# Patient Record
Sex: Male | Born: 1975
Health system: Southern US, Community
[De-identification: ages and names within clinical notes are randomized; demographics above are authoritative.]

## PROBLEM LIST (undated history)

## (undated) DIAGNOSIS — L409 Psoriasis, unspecified: Secondary | ICD-10-CM

## (undated) DIAGNOSIS — M549 Dorsalgia, unspecified: Secondary | ICD-10-CM

## (undated) DIAGNOSIS — G8929 Other chronic pain: Secondary | ICD-10-CM

## (undated) DIAGNOSIS — R519 Headache, unspecified: Secondary | ICD-10-CM

## (undated) DIAGNOSIS — R51 Headache: Secondary | ICD-10-CM

## (undated) DIAGNOSIS — Z8709 Personal history of other diseases of the respiratory system: Secondary | ICD-10-CM

## (undated) DIAGNOSIS — M62838 Other muscle spasm: Secondary | ICD-10-CM

## (undated) DIAGNOSIS — G629 Polyneuropathy, unspecified: Secondary | ICD-10-CM

## (undated) DIAGNOSIS — K5900 Constipation, unspecified: Secondary | ICD-10-CM

---
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
## 2010-02-02 HISTORY — PX: BACK SURGERY: SHX140

## 2011-03-17 ENCOUNTER — Encounter: Payer: Managed Care, Other (non HMO) | Admitting: Physical Medicine & Rehabilitation

## 2011-05-05 ENCOUNTER — Encounter
Payer: Managed Care, Other (non HMO) | Attending: Physical Medicine & Rehabilitation | Admitting: Physical Medicine & Rehabilitation

## 2011-05-05 DIAGNOSIS — M545 Low back pain, unspecified: Secondary | ICD-10-CM | POA: Insufficient documentation

## 2011-05-05 DIAGNOSIS — M961 Postlaminectomy syndrome, not elsewhere classified: Secondary | ICD-10-CM | POA: Insufficient documentation

## 2011-05-05 DIAGNOSIS — IMO0002 Reserved for concepts with insufficient information to code with codable children: Secondary | ICD-10-CM | POA: Insufficient documentation

## 2011-05-05 DIAGNOSIS — M47817 Spondylosis without myelopathy or radiculopathy, lumbosacral region: Secondary | ICD-10-CM

## 2011-05-05 DIAGNOSIS — R209 Unspecified disturbances of skin sensation: Secondary | ICD-10-CM | POA: Insufficient documentation

## 2011-05-05 DIAGNOSIS — M79609 Pain in unspecified limb: Secondary | ICD-10-CM | POA: Insufficient documentation

## 2011-05-05 NOTE — Progress Notes (Signed)
CHIEF COMPLAINT:  Back pain and leg pain.  HISTORY OF PRESENT ILLNESS:  This is a 35 year old white male who has had a history of 2 lumbar spine surgeries.  He had lumbar laminectomy in 2005 and then other laminectomy at L4-L5, L5-S1 in March 2011.  These were done in [place].  At the time of his back surgery he had numbness and pain in the legs as well as low back pain.  Since the back surgeries his pain is generally improves especially over the last 2 months.  His symptoms are primarily in the back and hips now.  They are worsen when he stands for long periods of time or sits.  He found walking is fair rather than sitting.  He tries to stay active in elliptical and has lost some weight, notes that has improved his pain. He attributes lot of his problems to heavy weight lifting when he was younger.  Pain 4-5/10, described as sharp, burning, tingling, and aching.  Pain interferes with general activities, relations with others, enjoyment of life on a moderate level.  REVIEW OF SYSTEMS:  Notable for numbness, tingling, and spasms.  Full 12- point review is in the written health and history section of the chart.  CURRENT MEDICATIONS: 1. Hydrocodone 10/650 after one twice daily as needed.  He rarely     using this at this point. 2. Flexeril 10 mg t.i.d. 3. Lovastatin 40 mg daily. 4. Benazepril 40 mg b.i.d. 5. Trilipix 45 mg daily. 6. Ibuprofen 250 mg q.4 h. p.r.n.  PAST MEDICAL HISTORY:  Notable for hypertension, increased lipids, and back problems as mentioned above.  SOCIAL HISTORY:  The patient is single, lives with a roommate.  He drinks 3-4 drinks a week.  He works as a Production designer, theatre/television/film at [employer] in [place].  He works about 50 hours per week.  FAMILY HISTORY:  Negative and full review is in the written health and history section of the chart.  RADIOGRAPHY:  I reviewed a MRI from September of last year which showed only mild degenerative disk disease at L5-S1 and his  posterior laminectomy at L4-L5, L5-S1.  PHYSICAL EXAMINATION:  GENERAL:  The patient is pleasant, alert, and oriented x3.  He is fairly big built. VITAL SIGNS:  Blood pressure 125/77, pulse 97, respiratory rate 16, he is satting 98% on room air. MUSCULOSKELETAL:  Standing posture is fair.  He is able to bend only to about 75 degrees with his back was too tight.  Hamstrings were both tight as well as gastrocs, abductors.  Surgical incision was noted in the area around, this was slightly tender but not extremely more so with pressure than at rest.  Straight leg raising only revealed hamstring tightness.  His Luisa Hart test and compression test were negative. Reflexes are 1+ and 2+ right knee and ankle, 2+ left knee and ankle today.  Sensory exam is diminished over the L4-L5 perhaps S1 areas bilaterally right more than left.  Strength is grossly 4-5/5 throughout with some pain inhibition proximally at the right knee with extension, right hip flexion and right hip abduction.  Upper extremity strength is 5/5. HEART:  Regular. CHEST:  Clear. ABDOMEN:  Soft and nontender.  ASSESSMENT:  Post laminectomy syndrome with mild residual radiculopathy and persistent low back pain.  The patient is tight in his core muscles and lack strength in overall range of motion there too.  Posture is fairly intact.  PLAN: 1. We discussed options today, considering that he is improving.  I     think pursuing physical therapy is the most appropriate avenue.  I     recommended him attending physical therapy to address core muscle     strengthening and lengthening.  We will pursue Pilates program.  We     can look at modalities which may have benefit as well.  The patient     does have the tilt table at home which he finds useful and I think     this certainly could supplement his therapy and I think he is using     more than the 3 days a week that he currently is.  They wants to     improve his strength and his  core as well as his range of motion,     he could pursue physical activity on his own.  I think he could     come off some of the medications he is currently taking as well     including the hydrocodone.  I discussed the fact at that this point     we can't improve his disks, but we could certainly improve his     supporting musculature around them and periotically unload the     spinal segments.  He expressed an understanding and like this     approach. 2. I will see the patient back here in about 2 months.  He is to call     me if any problems or questions.     Ranelle Oyster, M.D. Electronically Signed    ZTS/MedQ D:  05/05/2011 12:59:21  T:  05/05/2011 14:07:27  Job #:  [id number]  cc:   Dr. Albertina Parr [facility] [place], [place] [address]

## 2011-07-03 ENCOUNTER — Encounter: Payer: Managed Care, Other (non HMO) | Admitting: Physical Medicine & Rehabilitation

## 2011-08-01 ENCOUNTER — Encounter
Payer: Managed Care, Other (non HMO) | Attending: Physical Medicine & Rehabilitation | Admitting: Physical Medicine & Rehabilitation

## 2013-01-29 ENCOUNTER — Other Ambulatory Visit: Payer: Self-pay | Admitting: Neurological Surgery

## 2013-01-29 DIAGNOSIS — M549 Dorsalgia, unspecified: Secondary | ICD-10-CM

## 2013-02-03 ENCOUNTER — Ambulatory Visit
Admission: RE | Admit: 2013-02-03 | Discharge: 2013-02-03 | Disposition: A | Discharge status: Home / Self Care | Payer: Managed Care, Other (non HMO) | Source: Ambulatory Visit | Attending: Neurological Surgery | Admitting: Neurological Surgery

## 2013-02-03 ENCOUNTER — Other Ambulatory Visit: Payer: Self-pay | Admitting: Neurological Surgery

## 2013-02-03 DIAGNOSIS — M549 Dorsalgia, unspecified: Secondary | ICD-10-CM

## 2013-02-03 MED ORDER — METHYLPREDNISOLONE ACETATE 40 MG/ML INJ SUSP (RADIOLOG
120.0000 mg | Freq: Once | INTRAMUSCULAR | Status: AC
  Administered 2013-02-03: 120 mg via INTRA_ARTICULAR

## 2013-02-03 MED ORDER — IOHEXOL 180 MG/ML  SOLN
2.0000 mL | Freq: Once | INTRAMUSCULAR | Status: AC | PRN
  Administered 2013-02-03: 2 mL via INTRA_ARTICULAR

## 2013-03-20 ENCOUNTER — Other Ambulatory Visit: Payer: Self-pay | Admitting: Neurological Surgery

## 2013-03-20 DIAGNOSIS — M5416 Radiculopathy, lumbar region: Secondary | ICD-10-CM

## 2013-03-25 ENCOUNTER — Ambulatory Visit
Admission: RE | Admit: 2013-03-25 | Discharge: 2013-03-25 | Disposition: A | Discharge status: Home / Self Care | Payer: Managed Care, Other (non HMO) | Source: Ambulatory Visit | Attending: Neurological Surgery | Admitting: Neurological Surgery

## 2013-03-25 VITALS — BP 107/71 | HR 88

## 2013-03-25 DIAGNOSIS — M5416 Radiculopathy, lumbar region: Secondary | ICD-10-CM

## 2013-03-25 MED ORDER — IOHEXOL 180 MG/ML  SOLN
15.0000 mL | Freq: Once | INTRAMUSCULAR | Status: AC | PRN
  Administered 2013-03-25: 15 mL via INTRATHECAL

## 2013-03-25 MED ORDER — DIAZEPAM 5 MG PO TABS
10.0000 mg | ORAL_TABLET | Freq: Once | ORAL | Status: AC
  Administered 2013-03-25: 10 mg via ORAL

## 2013-03-25 NOTE — Progress Notes (Signed)
Discharge instructions explained to pt. 

## 2013-04-07 ENCOUNTER — Other Ambulatory Visit: Payer: Self-pay | Admitting: Neurosurgery

## 2013-04-07 DIAGNOSIS — M47817 Spondylosis without myelopathy or radiculopathy, lumbosacral region: Secondary | ICD-10-CM

## 2013-04-07 DIAGNOSIS — M549 Dorsalgia, unspecified: Secondary | ICD-10-CM

## 2013-04-07 DIAGNOSIS — IMO0002 Reserved for concepts with insufficient information to code with codable children: Secondary | ICD-10-CM

## 2013-05-12 ENCOUNTER — Ambulatory Visit
Admission: RE | Admit: 2013-05-12 | Discharge: 2013-05-12 | Disposition: A | Discharge status: Home / Self Care | Payer: Managed Care, Other (non HMO) | Source: Ambulatory Visit | Attending: Neurosurgery | Admitting: Neurosurgery

## 2013-05-12 DIAGNOSIS — M549 Dorsalgia, unspecified: Secondary | ICD-10-CM

## 2013-05-12 DIAGNOSIS — M47817 Spondylosis without myelopathy or radiculopathy, lumbosacral region: Secondary | ICD-10-CM

## 2013-05-12 DIAGNOSIS — IMO0002 Reserved for concepts with insufficient information to code with codable children: Secondary | ICD-10-CM

## 2013-05-22 ENCOUNTER — Other Ambulatory Visit: Payer: Managed Care, Other (non HMO)

## 2013-06-06 ENCOUNTER — Other Ambulatory Visit: Payer: Self-pay | Admitting: Neurological Surgery

## 2013-06-24 ENCOUNTER — Encounter (HOSPITAL_COMMUNITY)
Admission: RE | Admit: 2013-06-24 | Discharge: 2013-06-24 | Disposition: A | Discharge status: Home / Self Care | Payer: Managed Care, Other (non HMO) | Source: Ambulatory Visit | Attending: Neurological Surgery | Admitting: Neurological Surgery

## 2013-06-24 ENCOUNTER — Ambulatory Visit (HOSPITAL_COMMUNITY)
Admission: RE | Admit: 2013-06-24 | Discharge: 2013-06-24 | Disposition: A | Discharge status: Home / Self Care | Payer: Managed Care, Other (non HMO) | Source: Ambulatory Visit | Attending: Anesthesiology | Admitting: Anesthesiology

## 2013-06-24 ENCOUNTER — Encounter (HOSPITAL_COMMUNITY): Payer: Self-pay

## 2013-06-24 DIAGNOSIS — Z01810 Encounter for preprocedural cardiovascular examination: Secondary | ICD-10-CM | POA: Insufficient documentation

## 2013-06-24 DIAGNOSIS — Z01812 Encounter for preprocedural laboratory examination: Secondary | ICD-10-CM | POA: Insufficient documentation

## 2013-06-24 DIAGNOSIS — Z01818 Encounter for other preprocedural examination: Secondary | ICD-10-CM | POA: Insufficient documentation

## 2013-06-24 LAB — CBC WITH DIFFERENTIAL/PLATELET
Basophils Absolute: 0 10*3/uL (ref 0.0–0.1)
Eosinophils Relative: 2 % (ref 0–5)
HCT: 44 % (ref 39.0–52.0)
Hemoglobin: 15.3 g/dL (ref 13.0–17.0)
Lymphocytes Relative: 32 % (ref 12–46)
Lymphs Abs: 2.4 10*3/uL (ref 0.7–4.0)
MCV: 88 fL (ref 78.0–100.0)
Monocytes Absolute: 0.5 10*3/uL (ref 0.1–1.0)
Monocytes Relative: 7 % (ref 3–12)
Neutro Abs: 4.4 10*3/uL (ref 1.7–7.7)
RBC: 5 MIL/uL (ref 4.22–5.81)
WBC: 7.5 10*3/uL (ref 4.0–10.5)

## 2013-06-24 LAB — BASIC METABOLIC PANEL
CO2: 25 mEq/L (ref 19–32)
Chloride: 101 mEq/L (ref 96–112)
Creatinine, Ser: 0.84 mg/dL (ref 0.50–1.35)
GFR calc Af Amer: >90 mL/min (ref >90)
Potassium: 3.9 mEq/L (ref 3.5–5.1)
Sodium: 138 mEq/L (ref 135–145)

## 2013-06-24 LAB — SURGICAL PCR SCREEN
MRSA, PCR: NEGATIVE
Staphylococcus aureus: NEGATIVE

## 2013-06-24 LAB — TYPE AND SCREEN
ABO/RH(D): A NEG
Antibody Screen: NEGATIVE

## 2013-06-24 LAB — ABO/RH: ABO/RH(D): A NEG

## 2013-06-24 LAB — PROTIME-INR: INR: 0.9 (ref 0.00–1.49)

## 2013-06-24 NOTE — Pre-Procedure Instructions (Addendum)
[name]  06/24/2013   Your procedure is scheduled on: 07/03/13  Report to [facility]  2 * 3 at 530 AM.  Call this number if you have problems the morning of surgery: [phone]   Remember:   Do not eat food or drink liquids after midnight.   Take these medicines the morning of surgery with A SIP OF WATER: pain med, gabapentin,flexeril   Do not wear jewelry, make-up or nail polish.  Do not wear lotions, powders, or perfumes. You may wear deodorant.  Do not shave 48 hours prior to surgery. Men may shave face and neck.  Do not bring valuables to the hospital.  [facility] is not responsible                  for any belongings or valuables.               Contacts, dentures or bridgework may not be worn into surgery.  Leave suitcase in the car. After surgery it may be brought to your room.  For patients admitted to the hospital, discharge time is determined by your                treatment team.               Patients discharged the day of surgery will not be allowed to drive  home.  Name and phone number of your driver:   Special Instructions: Shower using CHG 2 nights before surgery and the night before surgery.  If you shower the day of surgery use CHG.  Use special wash - you have one bottle of CHG for all showers.  You should use approximately 1/3 of the bottle for each shower.   Please read over the following fact sheets that you were given: Pain Booklet, Coughing and Deep Breathing, Blood Transfusion Information, MRSA Information and Surgical Site Infection Prevention

## 2013-06-25 NOTE — Progress Notes (Signed)
Anesthesia Chart Review:  Patient is a 37 year old male scheduled for L4-5, L5-S1 MAS, PLIF on 07/03/13 by Dr. Yetta Barre.  History includes non-smoker, HTN, previous back surgery.  PCP is listed as Dr. Albertina Parr.  Preoperative labs noted.  EKG on 06/24/13 showed NSR.  CXR on 06/24/13 showed: 1. No active lung disease.  2. Minimally prominent right paratracheal soft tissue most likely vascular. Recommend attention to this area on followup chest x-ray.  I called CXR result to [facility] at Dr. Yetta Barre' office.  She will forward to Dr. Yetta Barre for review and additional recommendations as it could be likely be re-evaluated post-operatively or on an out-patient basis by his PCP.  If not acute symptoms, finding should not interfere with proceeding to OR.  Velna [facility]/Anesthesiology Phone [phone] 06/25/2013 3:28 PM

## 2013-07-02 MED ORDER — CEFAZOLIN SODIUM-DEXTROSE 2-3 GM-% IV SOLR
2.0000 g | INTRAVENOUS | Status: AC
  Administered 2013-07-03: 2 g via INTRAVENOUS
  Filled 2013-07-02: qty 50

## 2013-07-03 ENCOUNTER — Inpatient Hospital Stay (HOSPITAL_COMMUNITY)
Admission: RE | Admit: 2013-07-03 | Discharge: 2013-07-06 | DRG: 460 | Disposition: A | Discharge status: Home / Self Care | Payer: 59 | Source: Ambulatory Visit | Attending: Neurological Surgery | Admitting: Neurological Surgery

## 2013-07-03 ENCOUNTER — Encounter (HOSPITAL_COMMUNITY)
Admission: RE | Disposition: A | Discharge status: Home / Self Care | Payer: Managed Care, Other (non HMO) | Source: Ambulatory Visit | Attending: Neurological Surgery

## 2013-07-03 ENCOUNTER — Encounter (HOSPITAL_COMMUNITY): Payer: Self-pay | Admitting: Certified Registered Nurse Anesthetist

## 2013-07-03 ENCOUNTER — Inpatient Hospital Stay (HOSPITAL_COMMUNITY): Payer: 59

## 2013-07-03 ENCOUNTER — Inpatient Hospital Stay (HOSPITAL_COMMUNITY): Payer: 59 | Admitting: Certified Registered Nurse Anesthetist

## 2013-07-03 ENCOUNTER — Encounter (HOSPITAL_COMMUNITY): Payer: 59 | Admitting: Vascular Surgery

## 2013-07-03 DIAGNOSIS — M51379 Other intervertebral disc degeneration, lumbosacral region without mention of lumbar back pain or lower extremity pain: Principal | ICD-10-CM | POA: Diagnosis present

## 2013-07-03 DIAGNOSIS — I10 Essential (primary) hypertension: Secondary | ICD-10-CM | POA: Diagnosis present

## 2013-07-03 DIAGNOSIS — M5137 Other intervertebral disc degeneration, lumbosacral region: Principal | ICD-10-CM | POA: Diagnosis present

## 2013-07-03 DIAGNOSIS — Z981 Arthrodesis status: Secondary | ICD-10-CM

## 2013-07-03 DIAGNOSIS — M961 Postlaminectomy syndrome, not elsewhere classified: Secondary | ICD-10-CM | POA: Diagnosis present

## 2013-07-03 HISTORY — PX: LUMBAR LAMINECTOMY: SHX95

## 2013-07-03 HISTORY — PX: MAXIMUM ACCESS (MAS)POSTERIOR LUMBAR INTERBODY FUSION (PLIF) 2 LEVEL: SHX6369

## 2013-07-03 SURGERY — FOR MAXIMUM ACCESS (MAS) POSTERIOR LUMBAR INTERBODY FUSION (PLIF) 2 LEVEL
Anesthesia: General | Site: Back | Wound class: Clean

## 2013-07-03 MED ORDER — DEXAMETHASONE 4 MG PO TABS
4.0000 mg | ORAL_TABLET | Freq: Four times a day (QID) | ORAL | Status: DC
  Administered 2013-07-03 – 2013-07-06 (×11): 4 mg via ORAL
  Filled 2013-07-03 (×15): qty 1

## 2013-07-03 MED ORDER — FENTANYL CITRATE 0.05 MG/ML IJ SOLN
INTRAMUSCULAR | Status: DC | PRN
  Administered 2013-07-03: 100 ug via INTRAVENOUS
  Administered 2013-07-03: 250 ug via INTRAVENOUS
  Administered 2013-07-03 (×8): 50 ug via INTRAVENOUS

## 2013-07-03 MED ORDER — ALBUMIN HUMAN 5 % IV SOLN
INTRAVENOUS | Status: DC | PRN
  Administered 2013-07-03: 11:00:00 via INTRAVENOUS

## 2013-07-03 MED ORDER — POTASSIUM CHLORIDE IN NACL 20-0.9 MEQ/L-% IV SOLN
INTRAVENOUS | Status: DC
  Administered 2013-07-04 – 2013-07-05 (×3): via INTRAVENOUS
  Filled 2013-07-03 (×7): qty 1000

## 2013-07-03 MED ORDER — SUCCINYLCHOLINE CHLORIDE 20 MG/ML IJ SOLN
INTRAMUSCULAR | Status: DC | PRN
  Administered 2013-07-03: 120 mg via INTRAVENOUS

## 2013-07-03 MED ORDER — HYDROMORPHONE HCL PF 1 MG/ML IJ SOLN
INTRAMUSCULAR | Status: AC
  Filled 2013-07-03: qty 1

## 2013-07-03 MED ORDER — METHOCARBAMOL 500 MG PO TABS
500.0000 mg | ORAL_TABLET | Freq: Four times a day (QID) | ORAL | Status: DC | PRN
  Administered 2013-07-04 – 2013-07-06 (×5): 500 mg via ORAL
  Filled 2013-07-03 (×6): qty 1

## 2013-07-03 MED ORDER — SODIUM CHLORIDE 0.9 % IV SOLN: 250.0000 mL | INTRAVENOUS | Status: DC

## 2013-07-03 MED ORDER — MENTHOL 3 MG MT LOZG: 1.0000 | LOZENGE | OROMUCOSAL | Status: DC | PRN

## 2013-07-03 MED ORDER — CEFAZOLIN SODIUM-DEXTROSE 2-3 GM-% IV SOLR
INTRAVENOUS | Status: AC
  Administered 2013-07-03: 2 g via INTRAVENOUS
  Filled 2013-07-03: qty 50

## 2013-07-03 MED ORDER — DIAZEPAM 5 MG/ML IJ SOLN
2.5000 mg | Freq: Once | INTRAMUSCULAR | Status: AC
  Administered 2013-07-03: 2.5 mg via INTRAVENOUS

## 2013-07-03 MED ORDER — DIAZEPAM 5 MG/ML IJ SOLN
INTRAMUSCULAR | Status: AC
  Filled 2013-07-03: qty 2

## 2013-07-03 MED ORDER — DIPHENHYDRAMINE HCL 50 MG/ML IJ SOLN: 12.5000 mg | Freq: Four times a day (QID) | INTRAMUSCULAR | Status: DC | PRN

## 2013-07-03 MED ORDER — LIDOCAINE HCL (CARDIAC) 20 MG/ML IV SOLN
INTRAVENOUS | Status: DC | PRN
  Administered 2013-07-03: 80 mg via INTRAVENOUS

## 2013-07-03 MED ORDER — DEXAMETHASONE SODIUM PHOSPHATE 10 MG/ML IJ SOLN: 10.0000 mg | INTRAMUSCULAR | Status: DC

## 2013-07-03 MED ORDER — SODIUM CHLORIDE 0.9 % IV SOLN
INTRAVENOUS | Status: DC | PRN
  Administered 2013-07-03: 12:00:00 via INTRAVENOUS

## 2013-07-03 MED ORDER — SODIUM CHLORIDE 0.9 % IR SOLN
Status: DC | PRN
  Administered 2013-07-03: 08:00:00

## 2013-07-03 MED ORDER — MORPHINE SULFATE 2 MG/ML IJ SOLN: 1.0000 mg | INTRAMUSCULAR | Status: DC | PRN

## 2013-07-03 MED ORDER — ONDANSETRON HCL 4 MG/2ML IJ SOLN: 4.0000 mg | Freq: Four times a day (QID) | INTRAMUSCULAR | Status: DC | PRN

## 2013-07-03 MED ORDER — DIPHENHYDRAMINE HCL 12.5 MG/5ML PO ELIX: 12.5000 mg | ORAL_SOLUTION | Freq: Four times a day (QID) | ORAL | Status: DC | PRN

## 2013-07-03 MED ORDER — 0.9 % SODIUM CHLORIDE (POUR BTL) OPTIME
TOPICAL | Status: DC | PRN
  Administered 2013-07-03: 1000 mL

## 2013-07-03 MED ORDER — SENNA 8.6 MG PO TABS
1.0000 | ORAL_TABLET | Freq: Two times a day (BID) | ORAL | Status: DC
  Administered 2013-07-03 – 2013-07-06 (×6): 8.6 mg via ORAL
  Filled 2013-07-03 (×7): qty 1

## 2013-07-03 MED ORDER — ONDANSETRON HCL 4 MG/2ML IJ SOLN: 4.0000 mg | INTRAMUSCULAR | Status: DC | PRN

## 2013-07-03 MED ORDER — SODIUM CHLORIDE 0.9 % IJ SOLN: 9.0000 mL | INTRAMUSCULAR | Status: DC | PRN

## 2013-07-03 MED ORDER — ACETAMINOPHEN 650 MG RE SUPP: 650.0000 mg | RECTAL | Status: DC | PRN

## 2013-07-03 MED ORDER — LACTATED RINGERS IV SOLN
INTRAVENOUS | Status: DC | PRN
  Administered 2013-07-03 (×3): via INTRAVENOUS

## 2013-07-03 MED ORDER — NALOXONE HCL 0.4 MG/ML IJ SOLN: 0.4000 mg | INTRAMUSCULAR | Status: DC | PRN

## 2013-07-03 MED ORDER — LIDOCAINE HCL 4 % MT SOLN
OROMUCOSAL | Status: DC | PRN
  Administered 2013-07-03: 4 mL via TOPICAL

## 2013-07-03 MED ORDER — SODIUM CHLORIDE 0.9 % IJ SOLN: 3.0000 mL | INTRAMUSCULAR | Status: DC | PRN

## 2013-07-03 MED ORDER — DEXTROSE 5 % IV SOLN
500.0000 mg | Freq: Four times a day (QID) | INTRAVENOUS | Status: DC | PRN
  Administered 2013-07-03: 500 mg via INTRAVENOUS
  Filled 2013-07-03: qty 5

## 2013-07-03 MED ORDER — ONDANSETRON HCL 4 MG/2ML IJ SOLN
INTRAMUSCULAR | Status: DC | PRN
  Administered 2013-07-03: 4 mg via INTRAVENOUS

## 2013-07-03 MED ORDER — HYDROMORPHONE HCL PF 1 MG/ML IJ SOLN
0.2500 mg | INTRAMUSCULAR | Status: DC | PRN
Start: 2013-07-03 — End: 2013-07-03
  Administered 2013-07-03 (×4): 0.5 mg via INTRAVENOUS

## 2013-07-03 MED ORDER — GABAPENTIN 300 MG PO CAPS
600.0000 mg | ORAL_CAPSULE | Freq: Three times a day (TID) | ORAL | Status: DC
  Administered 2013-07-03 – 2013-07-06 (×8): 600 mg via ORAL
  Filled 2013-07-03 (×10): qty 2

## 2013-07-03 MED ORDER — KETOROLAC TROMETHAMINE 30 MG/ML IJ SOLN
INTRAMUSCULAR | Status: DC | PRN
  Administered 2013-07-03: 30 mg via INTRAVENOUS

## 2013-07-03 MED ORDER — ZOLPIDEM TARTRATE 5 MG PO TABS: 5.0000 mg | ORAL_TABLET | Freq: Every evening | ORAL | Status: DC | PRN

## 2013-07-03 MED ORDER — CEFAZOLIN SODIUM 1-5 GM-% IV SOLN
1.0000 g | Freq: Three times a day (TID) | INTRAVENOUS | Status: AC
  Administered 2013-07-03 – 2013-07-04 (×2): 1 g via INTRAVENOUS
  Filled 2013-07-03 (×2): qty 50

## 2013-07-03 MED ORDER — DEXAMETHASONE SODIUM PHOSPHATE 10 MG/ML IJ SOLN
INTRAMUSCULAR | Status: AC
  Administered 2013-07-03: 10 mg via INTRAVENOUS
  Filled 2013-07-03: qty 1

## 2013-07-03 MED ORDER — ACETAMINOPHEN 10 MG/ML IV SOLN
INTRAVENOUS | Status: AC
  Administered 2013-07-03: 1000 mg via INTRAVENOUS
  Filled 2013-07-03: qty 100

## 2013-07-03 MED ORDER — SODIUM CHLORIDE 0.9 % IJ SOLN
3.0000 mL | Freq: Two times a day (BID) | INTRAMUSCULAR | Status: DC
  Administered 2013-07-03 – 2013-07-04 (×2): 3 mL via INTRAVENOUS

## 2013-07-03 MED ORDER — HYDROMORPHONE 0.3 MG/ML IV SOLN
INTRAVENOUS | Status: DC
  Administered 2013-07-03: 0.79 mg via INTRAVENOUS
  Administered 2013-07-03: 16:00:00 via INTRAVENOUS
  Administered 2013-07-03 – 2013-07-04 (×2): 2.59 mg via INTRAVENOUS
  Administered 2013-07-04: 02:00:00 via INTRAVENOUS
  Administered 2013-07-04: 0.3 mg via INTRAVENOUS
  Administered 2013-07-04: 1.39 mg via INTRAVENOUS
  Administered 2013-07-04: 1.99 mg via INTRAVENOUS
  Administered 2013-07-05: 0.999 mg via INTRAVENOUS
  Administered 2013-07-05: 0.2 mg via INTRAVENOUS
  Administered 2013-07-05: 1.79 mg via INTRAVENOUS
  Filled 2013-07-03 (×2): qty 25

## 2013-07-03 MED ORDER — PROPOFOL INFUSION 10 MG/ML OPTIME
INTRAVENOUS | Status: DC | PRN
  Administered 2013-07-03: 50 ug/kg/min via INTRAVENOUS

## 2013-07-03 MED ORDER — CELECOXIB 200 MG PO CAPS
200.0000 mg | ORAL_CAPSULE | Freq: Two times a day (BID) | ORAL | Status: DC
  Administered 2013-07-03 – 2013-07-06 (×6): 200 mg via ORAL
  Filled 2013-07-03 (×7): qty 1

## 2013-07-03 MED ORDER — MIDAZOLAM HCL 5 MG/5ML IJ SOLN
INTRAMUSCULAR | Status: DC | PRN
  Administered 2013-07-03 (×2): 2 mg via INTRAVENOUS

## 2013-07-03 MED ORDER — THROMBIN 20000 UNITS EX SOLR
CUTANEOUS | Status: DC | PRN
  Administered 2013-07-03: 08:00:00 via TOPICAL

## 2013-07-03 MED ORDER — PHENOL 1.4 % MT LIQD: 1.0000 | OROMUCOSAL | Status: DC | PRN

## 2013-07-03 MED ORDER — OXYCODONE-ACETAMINOPHEN 5-325 MG PO TABS
1.0000 | ORAL_TABLET | ORAL | Status: DC | PRN
  Administered 2013-07-03 – 2013-07-06 (×6): 2 via ORAL
  Filled 2013-07-03 (×5): qty 2

## 2013-07-03 MED ORDER — OXYCODONE-ACETAMINOPHEN 5-325 MG PO TABS
ORAL_TABLET | ORAL | Status: AC
  Filled 2013-07-03: qty 2

## 2013-07-03 MED ORDER — HYDROMORPHONE 0.3 MG/ML IV SOLN
INTRAVENOUS | Status: AC
  Filled 2013-07-03: qty 25

## 2013-07-03 MED ORDER — ACETAMINOPHEN 325 MG PO TABS: 650.0000 mg | ORAL_TABLET | ORAL | Status: DC | PRN

## 2013-07-03 MED ORDER — THROMBIN 5000 UNITS EX SOLR
CUTANEOUS | Status: DC | PRN
  Administered 2013-07-03: 5000 [IU] via TOPICAL

## 2013-07-03 MED ORDER — HEMOSTATIC AGENTS (NO CHARGE) OPTIME
TOPICAL | Status: DC | PRN
  Administered 2013-07-03: 1 via TOPICAL

## 2013-07-03 MED ORDER — PROPOFOL 10 MG/ML IV BOLUS
INTRAVENOUS | Status: DC | PRN
  Administered 2013-07-03: 160 mg via INTRAVENOUS

## 2013-07-03 MED ORDER — DEXAMETHASONE SODIUM PHOSPHATE 4 MG/ML IJ SOLN
4.0000 mg | Freq: Four times a day (QID) | INTRAMUSCULAR | Status: DC
  Filled 2013-07-03 (×15): qty 1

## 2013-07-03 MED ORDER — METOCLOPRAMIDE HCL 5 MG/ML IJ SOLN: 5.0000 mg | Freq: Once | INTRAMUSCULAR | Status: DC

## 2013-07-03 MED ORDER — ARTIFICIAL TEARS OP OINT
TOPICAL_OINTMENT | OPHTHALMIC | Status: DC | PRN
  Administered 2013-07-03: 1 via OPHTHALMIC

## 2013-07-03 SURGICAL SUPPLY — 66 items
BAG DECANTER FOR FLEXI CONT (MISCELLANEOUS) ×2 IMPLANT
BENZOIN TINCTURE PRP APPL 2/3 (GAUZE/BANDAGES/DRESSINGS) ×2 IMPLANT
BLADE SURG ROTATE 9660 (MISCELLANEOUS) IMPLANT
BONE MATRIX OSTEOCEL PRO MED (Bone Implant) ×4 IMPLANT
BUR MATCHSTICK NEURO 3.0 LAGG (BURR) ×2 IMPLANT
CAGE COROENT MP 8X23 (Cage) ×4 IMPLANT
CAGE SPINAL COROENT MP 11X28X9 (Cage) ×2 IMPLANT
CANISTER SUCT 3000ML (MISCELLANEOUS) ×2 IMPLANT
CLIP NEUROVISION LG (CLIP) ×2 IMPLANT
CONT SPEC 4OZ CLIKSEAL STRL BL (MISCELLANEOUS) ×4 IMPLANT
COROENT MP 11X28X9MM (Cage) ×2 IMPLANT
COVER BACK TABLE 24X17X13 BIG (DRAPES) IMPLANT
COVER TABLE BACK 60X90 (DRAPES) ×2 IMPLANT
DRAPE C-ARM 42X72 X-RAY (DRAPES) ×2 IMPLANT
DRAPE C-ARMOR (DRAPES) ×2 IMPLANT
DRAPE LAPAROTOMY 100X72X124 (DRAPES) ×2 IMPLANT
DRAPE POUCH INSTRU U-SHP 10X18 (DRAPES) ×2 IMPLANT
DRAPE SURG 17X23 STRL (DRAPES) ×2 IMPLANT
DRESSING TELFA 8X3 (GAUZE/BANDAGES/DRESSINGS) IMPLANT
DRSG OPSITE 4X5.5 SM (GAUZE/BANDAGES/DRESSINGS) ×2 IMPLANT
DRSG OPSITE POSTOP 4X8 (GAUZE/BANDAGES/DRESSINGS) ×2 IMPLANT
DURAPREP 26ML APPLICATOR (WOUND CARE) ×2 IMPLANT
ELECT REM PT RETURN 9FT ADLT (ELECTROSURGICAL) ×2
ELECTRODE REM PT RTRN 9FT ADLT (ELECTROSURGICAL) ×1 IMPLANT
EVACUATOR 1/8 PVC DRAIN (DRAIN) ×2 IMPLANT
GAUZE SPONGE 4X4 16PLY XRAY LF (GAUZE/BANDAGES/DRESSINGS) IMPLANT
GLOVE BIO SURGEON STRL SZ8 (GLOVE) ×4 IMPLANT
GLOVE BIOGEL M 8.0 STRL (GLOVE) ×2 IMPLANT
GLOVE INDICATOR 7.0 STRL GRN (GLOVE) ×2 IMPLANT
GLOVE OPTIFIT SS 6.5 STRL BRWN (GLOVE) ×6 IMPLANT
GLOVE SURG SS PI 7.0 STRL IVOR (GLOVE) ×2 IMPLANT
GOWN BRE IMP SLV AUR LG STRL (GOWN DISPOSABLE) ×4 IMPLANT
GOWN BRE IMP SLV AUR XL STRL (GOWN DISPOSABLE) ×4 IMPLANT
GOWN STRL REIN 2XL LVL4 (GOWN DISPOSABLE) IMPLANT
HEMOSTAT POWDER KIT SURGIFOAM (HEMOSTASIS) ×2 IMPLANT
KIT BASIN OR (CUSTOM PROCEDURE TRAY) ×2 IMPLANT
KIT NEEDLE NVM5 EMG ELECT (KITS) ×1 IMPLANT
KIT NEEDLE NVM5 EMG ELECTRODE (KITS) ×1
KIT ROOM TURNOVER OR (KITS) ×2 IMPLANT
MILL MEDIUM DISP (BLADE) ×2 IMPLANT
NEEDLE HYPO 25X1 1.5 SAFETY (NEEDLE) ×2 IMPLANT
NS IRRIG 1000ML POUR BTL (IV SOLUTION) ×2 IMPLANT
PACK LAMINECTOMY NEURO (CUSTOM PROCEDURE TRAY) ×2 IMPLANT
PAD ARMBOARD 7.5X6 YLW CONV (MISCELLANEOUS) ×6 IMPLANT
ROD 70MM (Rod) ×2 IMPLANT
ROD SPNL 70XPREBNT NS MAS (Rod) ×2 IMPLANT
SCREW LOCK (Screw) ×6 IMPLANT
SCREW LOCK FXNS SPNE MAS PL (Screw) ×6 IMPLANT
SCREW PLIF MAS 5.0X35 (Screw) ×2 IMPLANT
SCREW SHANK 5.0X30MM (Screw) ×6 IMPLANT
SCREW SHANK 6.5X30 (Screw) ×2 IMPLANT
SCREW SHANK 6.5X65 (Screw) ×2 IMPLANT
SCREW TULIP 5.5 (Screw) ×10 IMPLANT
SPONGE LAP 4X18 X RAY DECT (DISPOSABLE) IMPLANT
SPONGE SURGIFOAM ABS GEL 100 (HEMOSTASIS) ×2 IMPLANT
STRIP CLOSURE SKIN 1/2X4 (GAUZE/BANDAGES/DRESSINGS) ×2 IMPLANT
SUT VIC AB 0 CT1 18XCR BRD8 (SUTURE) ×1 IMPLANT
SUT VIC AB 0 CT1 8-18 (SUTURE) ×1
SUT VIC AB 2-0 CP2 18 (SUTURE) ×4 IMPLANT
SUT VIC AB 3-0 SH 8-18 (SUTURE) ×4 IMPLANT
SYR 20ML ECCENTRIC (SYRINGE) ×2 IMPLANT
TOWEL OR 17X24 6PK STRL BLUE (TOWEL DISPOSABLE) ×2 IMPLANT
TOWEL OR 17X26 10 PK STRL BLUE (TOWEL DISPOSABLE) ×2 IMPLANT
TRAY FOLEY CATH 14FRSI W/METER (CATHETERS) IMPLANT
TRAY FOLEY METER SIL LF 16FR (CATHETERS) ×2 IMPLANT
WATER STERILE IRR 1000ML POUR (IV SOLUTION) ×2 IMPLANT

## 2013-07-03 NOTE — Anesthesia Postprocedure Evaluation (Signed)
  Anesthesia Post-op Note  Patient: [name]  Procedure(s) Performed: Procedure(s): FOR MAXIMUM ACCESS SURGERY POSTERIOR LUMBAR INTERBODY FUSION  LUMBAR FOUR-FIVE ,LUMBAR FIVE-SACRAL ONE (N/A)  Patient Location: PACU  Anesthesia Type:General  Level of Consciousness: awake  Airway and Oxygen Therapy: Patient Spontanous Breathing  Post-op Pain: mild  Post-op Assessment: Post-op Vital signs reviewed  Post-op Vital Signs: Reviewed  Complications: No apparent anesthesia complications

## 2013-07-03 NOTE — Anesthesia Procedure Notes (Signed)
Procedure Name: Intubation Date/Time: 07/03/2013 7:50 AM Performed by: Margaree Mackintosh Pre-anesthesia Checklist: Patient identified, Timeout performed, Emergency Drugs available, Suction available and Patient being monitored Patient Re-evaluated:Patient Re-evaluated prior to inductionOxygen Delivery Method: Circle system utilized Preoxygenation: Pre-oxygenation with 100% oxygen Intubation Type: IV induction Ventilation: Mask ventilation without difficulty Laryngoscope Size: Mac and 3 Grade View: Grade I Tube type: Oral Tube size: 7.5 mm Number of attempts: 1 Airway Equipment and Method: Stylet and LTA kit utilized Placement Confirmation: ETT inserted through vocal cords under direct vision,  positive ETCO2 and breath sounds checked- equal and bilateral Secured at: 23 cm Tube secured with: Tape Dental Injury: Teeth and Oropharynx as per pre-operative assessment

## 2013-07-03 NOTE — OR Nursing (Signed)
NUVASIVE NEEDLE ELECTRODES PLACED TO LOWER EXTREMITIES BY Danea Manter RN FOR NERVE MONITORING

## 2013-07-03 NOTE — Transfer of Care (Signed)
Immediate Anesthesia Transfer of Care Note  Patient: [name]  Procedure(s) Performed: Procedure(s): FOR MAXIMUM ACCESS SURGERY POSTERIOR LUMBAR INTERBODY FUSION  LUMBAR FOUR-FIVE ,LUMBAR FIVE-SACRAL ONE (N/A)  Patient Location: PACU  Anesthesia Type:General  Level of Consciousness: awake, alert  and oriented  Airway & Oxygen Therapy: Patient Spontanous Breathing and Patient connected to nasal cannula oxygen  Post-op Assessment: Report given to PACU RN, Post -op Vital signs reviewed and stable and Patient moving all extremities  Post vital signs: Reviewed and stable  Complications: No apparent anesthesia complications

## 2013-07-03 NOTE — H&P (Signed)
Subjective: Patient is a 37 y.o. male admitted for PLIF L4-5, L5-S1. Onset of symptoms was a few years ago, gradually worsening since that time. He is s/p diskectomy. The pain is rated severe, and is located at the across the lower back and radiates to legs. The pain is described as aching and throbbing and occurs all day. The symptoms have been progressive. Symptoms are exacerbated by exercise. MRI or CT showed DDD L4-5, L5-S1.   Past Medical History  Diagnosis Date  . Hypertension     rx d/c 12 after wt loss, diet control    Past Surgical History  Procedure Laterality Date  . Back surgery  06,11    Prior to Admission medications   Medication Sig Start Date End Date Taking? Authorizing Provider  cyclobenzaprine (FLEXERIL) 10 MG tablet Take 10 mg by mouth 3 (three) times daily as needed for muscle spasms.   Yes Historical Provider, MD  gabapentin (NEURONTIN) 300 MG capsule Take 600 mg by mouth 3 (three) times daily.   Yes Historical Provider, MD  oxyCODONE-acetaminophen (PERCOCET/ROXICET) 5-325 MG per tablet Take 1-2 tablets by mouth every 6 (six) hours as needed for pain.   Yes Historical Provider, MD   No Known Allergies  History  Substance Use Topics  . Smoking status: Never Smoker   . Smokeless tobacco: Never Used     Comment: occ alcohol  . Alcohol Use: Yes    No family history on file.   Review of Systems  Positive ROS: neg  All other systems have been reviewed and were otherwise negative with the exception of those mentioned in the HPI and as above.  Objective: Vital signs in last 24 hours: Temp:  [97.8 F (36.6 C)] 97.8 F (36.6 C) (10/30 0615) Pulse Rate:  [84] 84 (10/30 0605) Resp:  [20] 20 (10/30 0605) BP: (136)/(93) 136/93 mmHg (10/30 0605) SpO2:  [100 %] 100 % (10/30 0605)  General Appearance: Alert, cooperative, no distress, appears stated age Head: Normocephalic, without obvious abnormality, atraumatic Eyes: PERRL, conjunctiva/corneas clear, EOM's intact     Neck: Supple, symmetrical, trachea midline Back: Symmetric, no curvature, ROM normal, no CVA tenderness Lungs:  respirations unlabored Heart: Regular rate and rhythm Abdomen: Soft, non-tender Extremities: Extremities normal, atraumatic, no cyanosis or edema Pulses: 2+ and symmetric all extremities Skin: Skin color, texture, turgor normal, no rashes or lesions  NEUROLOGIC:   Mental status: Alert and oriented x4,  no aphasia, good attention span, fund of knowledge, and memory Motor Exam - grossly normal Sensory Exam - grossly normal Reflexes: 1+ Coordination - grossly normal Gait - grossly normal Balance - grossly normal Cranial Nerves: I: smell Not tested  II: visual acuity  OS: nl    OD: nl  II: visual fields Full to confrontation  II: pupils Equal, round, reactive to light  III,VII: ptosis None  III,IV,VI: extraocular muscles  Full ROM  V: mastication Normal  V: facial light touch sensation  Normal  V,VII: corneal reflex  Present  VII: facial muscle function - upper  Normal  VII: facial muscle function - lower Normal  VIII: hearing Not tested  IX: soft palate elevation  Normal  IX,X: gag reflex Present  XI: trapezius strength  5/5  XI: sternocleidomastoid strength 5/5  XI: neck flexion strength  5/5  XII: tongue strength  Normal    Data Review Lab Results  Component Value Date   WBC 7.5 06/24/2013   HGB 15.3 06/24/2013   HCT 44.0 06/24/2013   MCV 88.0  06/24/2013   PLT 241 06/24/2013   Lab Results  Component Value Date   NA 138 06/24/2013   K 3.9 06/24/2013   CL 101 06/24/2013   CO2 25 06/24/2013   BUN 16 06/24/2013   CREATININE 0.84 06/24/2013   GLUCOSE 103* 06/24/2013   Lab Results  Component Value Date   INR 0.90 06/24/2013    Assessment/Plan: Patient admitted for PLIF L4-5.Marland Kitchen Patient has failed a reasonable attempt at conservative therapy.  I explained the condition and procedure to the patient and answered any questions.  Patient wishes to  proceed with procedure as planned. Understands risks/ benefits and typical outcomes of procedure.   Fatema Rabe S 07/03/2013 7:41 AM

## 2013-07-03 NOTE — Anesthesia Preprocedure Evaluation (Addendum)
Anesthesia Evaluation  Patient identified by MRN, date of birth, ID band Patient awake    Reviewed: Allergy & Precautions, H&P , NPO status , Patient's Chart, lab work & pertinent test results  History of Anesthesia Complications Negative for: history of anesthetic complications  Airway Mallampati: I TM Distance: >3 FB Neck ROM: Full    Dental  (+) Teeth Intact and Dental Advisory Given   Pulmonary neg pulmonary ROS,    Pulmonary exam normal       Cardiovascular hypertension, negative cardio ROS      Neuro/Psych negative neurological ROS  negative psych ROS   GI/Hepatic Neg liver ROS, Occasional gastric reflux, not presently an issue   Endo/Other  negative endocrine ROS  Renal/GU negative Renal ROS     Musculoskeletal negative musculoskeletal ROS (+)   Abdominal   Peds  Hematology   Anesthesia Other Findings   Reproductive/Obstetrics                          Anesthesia Physical Anesthesia Plan  ASA: III  Anesthesia Plan: General   Post-op Pain Management:    Induction: Intravenous  Airway Management Planned: Oral ETT  Additional Equipment:   Intra-op Plan:   Post-operative Plan: Extubation in OR  Informed Consent: I have reviewed the patients History and Physical, chart, labs and discussed the procedure including the risks, benefits and alternatives for the proposed anesthesia with the patient or authorized representative who has indicated his/her understanding and acceptance.   Dental advisory given  Plan Discussed with: Anesthesiologist, Surgeon and CRNA  Anesthesia Plan Comments:        Anesthesia Quick Evaluation

## 2013-07-03 NOTE — Op Note (Signed)
07/03/2013  1:14 PM  PATIENT:  [name]  37 y.o. male  PRE-OPERATIVE DIAGNOSIS:  Post-laminectomy syndrome with degenerative disc disease and segmental instability at L4-5 and L5-S1 with back and leg pain  POST-OPERATIVE DIAGNOSIS:  Same  PROCEDURE:   1. repeat Decompressive lumbar laminectomy L4-5 L5-S1 requiring more work than would be required for a simple exposure of the disk for PLIF in order to adequately decompress the neural elements and address the spinal stenosis 2. Posterior lumbar interbody fusion L4-5 L5-S1 using PEEK interbody cages packed with morcellized allograft and autograft 3. Posterior fixation L4-S1 using cortical pedicle screws.  4. Intertransverse arthrodesis L4-S1 using morcellized autograft and allograft.  SURGEON:  Marikay Alar, MD  ASSISTANTS: Dr. Jeral Fruit  ANESTHESIA:  General  EBL: 350 ml  Total I/O In: 2525 [I.V.:2150; Blood:125; IV Piggyback:250] Out: 850 [Urine:500; Blood:350]  BLOOD ADMINISTERED:100 CC PRBC  DRAINS: Hemovac   INDICATION FOR PROCEDURE: This patient had undergone a previous decompressive laminectomy L4-5 and L5-S1. He presented with severe progressive low back pain with some leg pain. He had MRI and CT myelogram showed degenerative disease and collapse the disc space at L5-S1 were he had a previous laminectomy discogram had concordant pain at L4-5 and L5-S1. I recommended a posterior lumbar interbody fusion at L4-5 and L5-S1. Patient understood the risks, benefits, and alternatives and potential outcomes and wished to proceed.  PROCEDURE DETAILS:  The patient was brought to the operating room. After induction of generalized endotracheal anesthesia the patient was rolled into the prone position on chest rolls and all pressure points were padded. The patient's lumbar region was cleaned and then prepped with DuraPrep and draped in the usual sterile fashion. Anesthesia was injected and then a dorsal midline incision was made and carried  down to the lumbosacral fascia. The fascia was opened and the paraspinous musculature was taken down in a subperiosteal fashion to expose L4-5 and L5-S1. Had no spinous process of L4-L5 and great care was taken to identify the lamina and pars as well as the facets at both levels. A self-retaining retractor was placed. Intraoperative fluoroscopy confirmed my level, and I started with placement of the L4 and L5 cortical pedicle screws. The pedicle screw entry zones were identified utilizing surface landmarks and  AP and lateral fluoroscopy. I scored the cortex with the high-speed drill and then used the hand drill and EMG monitoring to drill an upward and outward direction into the pedicle. I then tapped line to line, and the tap was also monitored. I then placed a 5-0 by 30 mm cortical pedicle screw into the pedicles of L4 bilaterally, and at L5 on the left. I then turned my attention to the decompression and complete lumbar laminectomies, hemi- facetectomies, and foraminotomies were performed at L4-5 and L5-S1. The patient had significant spinal stenosis and this required more work than would be required for a simple exposure of the disc for posterior lumbar interbody fusion. He also had considerable scar tissue from his previous surgery in great care was taken to perform careful adhesiolysis. At no point did we see spinal fluid. Much more generous decompression was undertaken in order to adequately decompress the neural elements and address the patient's leg pain. The yellow ligament was removed to expose the underlying dura and nerve roots, and generous foraminotomies were performed to adequately decompress the neural elements. Both the exiting and traversing nerve roots were decompressed on both sides until a coronary dilator passed easily along the nerve roots. Once the decompression  was complete, I turned my attention to the posterior lower lumbar interbody fusion. The epidural venous vasculature was coagulated  and cut sharply. Disc space was incised and the initial discectomy was performed with pituitary rongeurs. The disc space was distracted with sequential distractors to a height of 12 mm at L4-5 and 8 mm at L5-S1. We then used a series of scrapers and shavers to prepare the endplates for fusion. The midline was prepared with Epstein curettes. Once the complete discectomy was finished, we packed an appropriate sized peek interbody cage with local autograft and morcellized allograft, gently retracted the nerve root, and tapped the cage into position at L4-5 and L5-S1.  The midline between the cages was packed with morselized autograft and allograft. We then turned our attention to the placement of the lower pedicle screws in the L5 pedicle screw on the right. The pedicle screw entry zones were identified utilizing surface landmarks and fluoroscopy. I drilled into each pedicle utilizing the hand drill and EMG monitoring, and tapped each pedicle with the appropriate tap. We palpated with a ball probe to assure no break in the cortex. We then placed 6-5 by 35 mm pedicle screws into the pedicles bilaterally at S1. A 5-0 x 35 mm cortical pedicle screw was placed at L5 on the right. We then decorticated the transverse processes and laid a mixture of morcellized autograft and allograft out over these to perform intertransverse arthrodesis at L4-S1. We then placed lordotic rods into the multiaxial screw heads of the pedicle screws and locked these in position with the locking caps and anti-torque device. We then checked our construct with AP and lateral fluoroscopy. Irrigated with copious amounts of bacitracin-containing saline solution. Placed a medium Hemovac drain through separate stab incision. Inspected the nerve roots once again to assure adequate decompression, lined to the dura with Gelfoam, and closed the muscle and the fascia with 0 Vicryl. Closed the subcutaneous tissues with 2-0 Vicryl and subcuticular tissues with  3-0 Vicryl. The skin was closed with benzoin and Steri-Strips. Dressing was then applied, the patient was awakened from general anesthesia and transported to the recovery room in stable condition. At the end of the procedure all sponge, needle and instrument counts were correct.   PLAN OF CARE: Admit to inpatient   PATIENT DISPOSITION:  PACU - hemodynamically stable.   Delay start of Pharmacological VTE agent (>24hrs) due to surgical blood loss or risk of bleeding:  yes

## 2013-07-03 NOTE — Preoperative (Signed)
Beta Blockers   Reason not to administer Beta Blockers:Not Applicable 

## 2013-07-04 ENCOUNTER — Encounter (HOSPITAL_COMMUNITY): Payer: Self-pay | Admitting: General Practice

## 2013-07-04 MED ORDER — INFLUENZA VAC SPLIT QUAD 0.5 ML IM SUSP
0.5000 mL | INTRAMUSCULAR | Status: AC
  Administered 2013-07-05: 0.5 mL via INTRAMUSCULAR
  Filled 2013-07-04: qty 0.5

## 2013-07-04 MED ORDER — BISACODYL 10 MG RE SUPP: 10.0000 mg | Freq: Once | RECTAL | Status: DC

## 2013-07-04 MED ORDER — FLEET ENEMA 7-19 GM/118ML RE ENEM: 1.0000 | ENEMA | Freq: Once | RECTAL | Status: DC

## 2013-07-04 MED FILL — Sodium Chloride IV Soln 0.9%: INTRAVENOUS | Qty: 2000 | Status: AC

## 2013-07-04 MED FILL — Heparin Sodium (Porcine) Inj 1000 Unit/ML: INTRAMUSCULAR | Qty: 30 | Status: AC

## 2013-07-04 NOTE — Evaluation (Signed)
Physical Therapy Evaluation Patient Details Name: [name] MRN: [id number] DOB: [birth year] Today's Date: 07/04/2013 Time: 1110-1130 PT Time Calculation (min): 20 min  PT Assessment / Plan / Recommendation History of Present Illness  Repeat Decompressive lumbar laminectomy L4-5 L5-S1 requiring more work than would be required for a simple exposure of the disk for PLIF in order to adequately decompress the neural elements and address the spinal stenosis,Posterior lumbar interbody fusion L4-5 L5-S1 using PEEK interbody cages packed with morcellized allograft and autograft ;Posterior fixation L4-S1 using cortical pedicle screws.;Intertransverse arthrodesis L4-S1 using morcellized autograft and allograft  Clinical Impression  Pt. Making good progress first time up with PT.  Denies pain in legs except for left hamstring and groin but says this is to lesser degree than PTA.  Needs PT to help increase his functional mobility status to mod I level for safe DC home.      PT Assessment  Patient needs continued PT services    Follow Up Recommendations  No PT follow up;Supervision/Assistance - 24 hour;Other (comment) (girlfriend to take 2 weeks off from work to be with pt.)    Does the patient have the potential to tolerate intense rehabilitation      Barriers to Discharge        Equipment Recommendations  Rolling walker with 5" wheels    Recommendations for Other Services     Frequency Min 6X/week    Precautions / Restrictions Precautions Precautions: Back Precaution Booklet Issued: Yes (comment) Precaution Comments: provided pt. with back precaution handout and reviewed back precautions and log rolling technique Required Braces or Orthoses: Spinal Brace Spinal Brace: Lumbar corset;Applied in sitting position Restrictions Weight Bearing Restrictions: No   Pertinent Vitals/Pain See vitals tab       Mobility  Bed Mobility Bed Mobility: Rolling Right;Right Sidelying to  Sit;Sitting - Scoot to Edge of Bed Rolling Right: 4: Min guard;With rail Right Sidelying to Sit: 4: Min guard;With rails;HOB flat Sitting - Scoot to Edge of Bed: 4: Min guard Details for Bed Mobility Assistance: VCs for techinique Transfers Transfers: Sit to Stand;Stand to Sit Sit to Stand: 4: Min assist;With upper extremity assist;From bed Stand to Sit: 4: Min assist;With upper extremity assist;With armrests;To chair/3-in-1 Details for Transfer Assistance: VCs for safe hand placement and use of LEs to boost self to standing position Ambulation/Gait Ambulation/Gait Assistance: 4: Min guard Ambulation Distance (Feet): 60 Feet Assistive device: Rolling walker Ambulation/Gait Assistance Details: cues for pushing RW and for erect stance, min guard assist for safety and stability Gait Pattern: Step-through pattern Gait velocity: decreased Stairs: No    Exercises     PT Diagnosis: Difficulty walking;Abnormality of gait;Acute pain  PT Problem List: Decreased activity tolerance;Decreased mobility;Decreased knowledge of use of DME;Decreased knowledge of precautions;Pain PT Treatment Interventions: DME instruction;Gait training;Stair training;Functional mobility training;Therapeutic activities;Patient/family education     PT Goals(Current goals can be found in the care plan section) Acute Rehab PT Goals Patient Stated Goal: Home over the weekend PT Goal Formulation: With patient Time For Goal Achievement: 07/11/13 Potential to Achieve Goals: Good  Visit Information  Last PT Received On: 07/04/13 Assistance Needed: +1 PT/OT Co-Evaluation/Treatment: Yes History of Present Illness: Repeat Decompressive lumbar laminectomy L4-5 L5-S1 requiring more work than would be required for a simple exposure of the disk for PLIF in order to adequately decompress the neural elements and address the spinal stenosis,Posterior lumbar interbody fusion L4-5 L5-S1 using PEEK interbody cages packed with  morcellized allograft and autograft ;Posterior fixation L4-S1 using cortical pedicle  screws.;Intertransverse arthrodesis L4-S1 using morcellized autograft and allograft       Prior Functioning  Home Living Family/patient expects to be discharged to:: Private residence Living Arrangements: Spouse/significant other Available Help at Discharge: Family;Available 24 hours/day Type of Home: House Home Access: Stairs to enter [employer] of Steps: 1 Entrance Stairs-Rails: None Home Layout: One level Home Equipment: Walker - standard;Toilet riser;Shower seat - built in Prior Function Level of Independence: Independent Communication Communication: No difficulties Dominant Hand: Right    Cognition  Cognition Arousal/Alertness: Awake/alert Behavior During Therapy: WFL for tasks assessed/performed Overall Cognitive Status: Within Functional Limits for tasks assessed    Extremity/Trunk Assessment Upper Extremity Assessment Upper Extremity Assessment: Overall WFL for tasks assessed Lower Extremity Assessment Lower Extremity Assessment: Overall WFL for tasks assessed   Balance    End of Session PT - End of Session Equipment Utilized During Treatment: Gait belt;Back brace Activity Tolerance: Patient tolerated treatment well Patient left: in chair;with call bell/phone within reach Nurse Communication: Mobility status  GP     Ferman Hamming 07/04/2013, 1:58 PM Weldon Picking PT Acute Rehab Services [phone] Beeper [phone]

## 2013-07-04 NOTE — Progress Notes (Signed)
   CARE MANAGEMENT NOTE 07/04/2013  Patient:  [name],[name]   Account Number:  [id number]  Date Initiated:  07/04/2013  Documentation initiated by:  [facility]  Subjective/Objective Assessment:   Decompressive lumbar laminectomy     Action/Plan:   no HH recomendations   Anticipated DC Date:  07/05/2013   Anticipated DC Plan:  HOME/SELF CARE      DC Planning Services  CM consult      Choice offered to / List presented to:             Status of service:  Completed, signed off Medicare Important Message given?   (If response is "NO", the following Medicare IM given date fields will be blank) Date Medicare IM given:   Date Additional Medicare IM given:    Discharge Disposition:  HOME/SELF CARE  Per UR Regulation:    If discussed at Long Length of Stay Meetings, dates discussed:    Comments:  07/04/2013 1715 NCM spoke to pt and offered choice for [facility]. Per PT/OT no HH needed. Pt states he has standard walker at home. Isidoro Donning RN CCM Case Mgmt phone [phone]

## 2013-07-04 NOTE — Progress Notes (Signed)
Patient ID: [name], male   DOB: 1976/03/12, 37 y.o.   MRN: [id number] The patient is posterior day 1 status post 2 level posterior lumbar interbody fusion. He's doing great. He has appropriate postoperative back soreness. No leg pain or numbness tingling or weakness. He has good strength on exam. His abdomen is soft. Continue pain control and start physical and occupational therapy. He seems very pleased, hopefully home tomorrow.

## 2013-07-04 NOTE — Evaluation (Signed)
Occupational Therapy Evaluation Patient Details Name: [name] MRN: [id number] DOB: Feb 12, 1976 Today's Date: 07/04/2013 Time: 6962-9528 OT Time Calculation (min): 24 min  OT Assessment / Plan / Recommendation History of present illness Repeat Decompressive lumbar laminectomy L4-5 L5-S1 requiring more work than would be required for a simple exposure of the disk for PLIF in order to adequately decompress the neural elements and address the spinal stenosis,Posterior lumbar interbody fusion L4-5 L5-S1 using PEEK interbody cages packed with morcellized allograft and autograft ;Posterior fixation L4-S1 using cortical pedicle screws.;Intertransverse arthrodesis L4-S1 using morcellized autograft and allograft   Clinical Impression   This 37 yo male admitted with above presents to acute OT with deficits below. Will benefit from acute OT without need for follow up.    OT Assessment  Patient needs continued OT Services    Follow Up Recommendations  No OT follow up    Barriers to Discharge      Equipment Recommendations  None recommended by OT    Recommendations for Other Services    Frequency  Min 2X/week    Precautions / Restrictions Precautions Precautions: Back Required Braces or Orthoses: Spinal Brace Spinal Brace: Lumbar corset;Applied in sitting position Restrictions Weight Bearing Restrictions: No   Pertinent Vitals/Pain 4/10 back    ADL  Eating/Feeding: Independent Where Assessed - Eating/Feeding: Chair Grooming: Set up Where Assessed - Grooming: Supported sitting Upper Body Bathing: Set up Where Assessed - Upper Body Bathing: Supported sitting Lower Body Bathing: Moderate assistance Where Assessed - Lower Body Bathing: Unsupported sit to stand Upper Body Dressing: Minimal assistance Where Assessed - Upper Body Dressing: Supported sitting Lower Body Dressing: Maximal assistance Where Assessed - Lower Body Dressing: Unsupported sit to stand Toilet Transfer: Minimal  assistance Toilet Transfer Method: Sit to stand Toilet Transfer Equipment:  (Bed>down hall>sit in recliner) Toileting - Clothing Manipulation and Hygiene: Minimal assistance Where Assessed - Engineer, mining and Hygiene: Sit to stand from 3-in-1 or toilet Equipment Used: Rolling walker;Gait belt;Back brace Transfers/Ambulation Related to ADLs: Min A for all with RW    OT Diagnosis: Generalized weakness;Acute pain  OT Problem List: Decreased range of motion;Impaired balance (sitting and/or standing);Decreased knowledge of use of DME or AE;Decreased knowledge of precautions;Pain OT Treatment Interventions: Self-care/ADL training;Balance training;DME and/or AE instruction;Patient/family education   OT Goals(Current goals can be found in the care plan section) Acute Rehab OT Goals Patient Stated Goal: Home over the weekend OT Goal Formulation: With patient Time For Goal Achievement: 07/11/13 Potential to Achieve Goals: Good  Visit Information  Last OT Received On: 07/04/13 Assistance Needed: +1 PT/OT Co-Evaluation/Treatment: Yes History of Present Illness: Repeat Decompressive lumbar laminectomy L4-5 L5-S1 requiring more work than would be required for a simple exposure of the disk for PLIF in order to adequately decompress the neural elements and address the spinal stenosis,Posterior lumbar interbody fusion L4-5 L5-S1 using PEEK interbody cages packed with morcellized allograft and autograft ;Posterior fixation L4-S1 using cortical pedicle screws.;Intertransverse arthrodesis L4-S1 using morcellized autograft and allograft       Prior Functioning     Home Living Family/patient expects to be discharged to:: Private residence Living Arrangements: Spouse/significant other Available Help at Discharge: Family;Available 24 hours/day Type of Home: House Home Access: Stairs to enter [employer] of Steps: 1 Entrance Stairs-Rails: None Home Layout: One level Home  Equipment: Walker - standard;Toilet riser;Shower seat - built in Lex[employer]ographer) Prior Function Level of Independence: Independent Communication Communication: No difficulties Dominant Hand: Right         Vision/Perception  Vision - History Patient Visual Report: No change from baseline   Cognition  Cognition Arousal/Alertness: Awake/alert Behavior During Therapy: WFL for tasks assessed/performed Overall Cognitive Status: Within Functional Limits for tasks assessed    Extremity/Trunk Assessment Upper Extremity Assessment Upper Extremity Assessment: Overall WFL for tasks assessed     Mobility Bed Mobility Bed Mobility: Rolling Right;Right Sidelying to Sit;Sitting - Scoot to Edge of Bed Rolling Right: 4: Min guard;With rail Right Sidelying to Sit: 4: Min guard;With rails;HOB flat Sitting - Scoot to Edge of Bed: 4: Min guard Details for Bed Mobility Assistance: VCs for techinique Transfers Transfers: Sit to Stand;Stand to Sit Sit to Stand: 4: Min assist;With upper extremity assist;From bed Stand to Sit: 4: Min assist;With upper extremity assist;With armrests;To chair/3-in-1 Details for Transfer Assistance: VCs for safe hand placement           End of Session OT - End of Session Equipment Utilized During Treatment: Gait belt;Rolling walker;Back brace Activity Tolerance: Patient tolerated treatment well Patient left: in chair;with call bell/phone within reach    Evette Georges [phone] 07/04/2013, 1:24 PM

## 2013-07-05 MED ORDER — MORPHINE SULFATE 2 MG/ML IJ SOLN
2.0000 mg | INTRAMUSCULAR | Status: DC | PRN
  Administered 2013-07-05 (×2): 2 mg via INTRAVENOUS
  Filled 2013-07-05 (×2): qty 1

## 2013-07-05 NOTE — Progress Notes (Signed)
Physical Therapy Treatment Patient Details Name: [name] MRN: [id number] DOB: 10/31/1975 Today's Date: 07/05/2013 Time: 4098-1191 PT Time Calculation (min): 28 min  PT Assessment / Plan / Recommendation  History of Present Illness Repeat Decompressive lumbar laminectomy L4-5 L5-S1 requiring more work than would be required for a simple exposure of the disk for PLIF in order to adequately decompress the neural elements and address the spinal stenosis,Posterior lumbar interbody fusion L4-5 L5-S1 using PEEK interbody cages packed with morcellized allograft and autograft ;Posterior fixation L4-S1 using cortical pedicle screws.;Intertransverse arthrodesis L4-S1 using morcellized autograft and allograft   PT Comments   Pt. Making excellent progress with mobility/gait. Able to negotiate steps with min assist and no rail.   Follow Up Recommendations  No PT follow up;Supervision/Assistance - 24 hour     Does the patient have the potential to tolerate intense rehabilitation     Barriers to Discharge        Equipment Recommendations  Rolling walker with 5" wheels    Recommendations for Other Services    Frequency Min 6X/week   Progress towards PT Goals Progress towards PT goals: Progressing toward goals  Plan Current plan remains appropriate    Precautions / Restrictions Precautions Precautions: Back Precaution Comments: pt. could stzate 2/3 back precatuions; needed prompting on no bending Required Braces or Orthoses: Spinal Brace Spinal Brace: Lumbar corset;Applied in sitting position Restrictions Weight Bearing Restrictions: No   Pertinent Vitals/Pain See vitals tab   Pt. Appears as though pain is well managed and not interfering with him mobilizing    Mobility  Bed Mobility Bed Mobility: Rolling Right;Right Sidelying to Sit;Sitting - Scoot to Edge of Bed Rolling Right: 5: Supervision Right Sidelying to Sit: 5: Supervision Sitting - Scoot to Edge of Bed: 5:  Supervision Details for Bed Mobility Assistance: reminder to maintain back precautions Transfers Transfers: Sit to Stand;Stand to Sit Sit to Stand: 5: Supervision;With upper extremity assist;From bed Stand to Sit: 5: Supervision;To chair/3-in-1;With upper extremity assist;With armrests Details for Transfer Assistance: reminder for hand placement to rise to stand Ambulation/Gait Ambulation/Gait Assistance: 5: Supervision Ambulation Distance (Feet): 400 Feet Assistive device: Rolling walker Ambulation/Gait Assistance Details: supervision for safety Gait Pattern: Step-through pattern Stairs: Yes Stairs Assistance: 4: Min assist Stairs Assistance Details (indicate cue type and reason): hand hold assist for support on L side Stair Management Technique: No rails;Step to pattern;Forwards Number of Stairs: 3    Exercises     PT Diagnosis:    PT Problem List:   PT Treatment Interventions:     PT Goals (current goals can now be found in the care plan section)    Visit Information  Last PT Received On: 07/05/13 Assistance Needed: +1 History of Present Illness: Repeat Decompressive lumbar laminectomy L4-5 L5-S1 requiring more work than would be required for a simple exposure of the disk for PLIF in order to adequately decompress the neural elements and address the spinal stenosis,Posterior lumbar interbody fusion L4-5 L5-S1 using PEEK interbody cages packed with morcellized allograft and autograft ;Posterior fixation L4-S1 using cortical pedicle screws.;Intertransverse arthrodesis L4-S1 using morcellized autograft and allograft    Subjective Data  Subjective: Plans on Dc tomorrow as girlfriend cant get here until after 9pm tonight to pick him up.   Cognition  Cognition Arousal/Alertness: Awake/alert Behavior During Therapy: WFL for tasks assessed/performed Overall Cognitive Status: Within Functional Limits for tasks assessed    Balance     End of Session PT - End of Session Equipment  Utilized During Treatment: Gait  belt;Back brace Activity Tolerance: Patient tolerated treatment well Patient left: in chair;with call bell/phone within reach Nurse Communication: Mobility status   GP     Ferman Hamming 07/05/2013, 12:15 PM Weldon Picking PT Acute Rehab Services [phone] Beeper [phone]

## 2013-07-05 NOTE — Progress Notes (Signed)
07/05/2013 1020 NCM spoke to pt and states 3n1 is not needed. He is requesting RW for home. Isidoro Donning RN CCM Case Mgmt phone [phone]

## 2013-07-05 NOTE — Progress Notes (Signed)
Occupational Therapy Treatment Patient Details Name: [name] MRN: [id number] DOB: [birth year] Today's Date: 07/05/2013 Time: 4696-2952 OT Time Calculation (min): 14 min  OT Assessment / Plan / Recommendation  History of present illness Repeat Decompressive lumbar laminectomy L4-5 L5-S1 requiring more work than would be required for a simple exposure of the disk for PLIF in order to adequately decompress the neural elements and address the spinal stenosis,Posterior lumbar interbody fusion L4-5 L5-S1 using PEEK interbody cages packed with morcellized allograft and autograft ;Posterior fixation L4-S1 using cortical pedicle screws.;Intertransverse arthrodesis L4-S1 using morcellized autograft and allograft   OT comments  Pt progressing toward goals.  Demonstrating good understanding of back precautions. Pt is hopeful to discharge home tomorrow.  Follow Up Recommendations  No OT follow up    Barriers to Discharge       Equipment Recommendations  None recommended by OT    Recommendations for Other Services    Frequency Min 2X/week   Progress towards OT Goals Progress towards OT goals: Progressing toward goals  Plan Discharge plan remains appropriate    Precautions / Restrictions Precautions Precautions: Back Precaution Booklet Issued: Yes (comment) Precaution Comments: Independently recalled 3/3 back precautions. Required Braces or Orthoses: Spinal Brace Spinal Brace: Lumbar corset;Applied in sitting position Restrictions Weight Bearing Restrictions: No   Pertinent Vitals/Pain See vitals    ADL  Upper Body Bathing: Simulated;Set up Where Assessed - Upper Body Bathing: Unsupported sitting Lower Body Bathing: Simulated;Supervision/safety Where Assessed - Lower Body Bathing: Unsupported sit to stand Upper Body Dressing: Performed;Set up Where Assessed - Upper Body Dressing: Unsupported sitting Lower Body Dressing: Performed;Supervision/safety Where Assessed - Lower Body  Dressing: Unsupported sit to stand Toilet Transfer: Simulated;Supervision/safety Toilet Transfer Method: Sit to Barista:  (bed) Equipment Used: Back brace;Rolling walker Transfers/Ambulation Related to ADLs: supervision with RW ADL Comments: Pt has reacher in room. States he has practiced using it for various tasks but has not used it yet for LB dressing.  Educated pt on use of reacher for donning LB clothing.  Pt able to bring foot up when sitting EOB but not enough donn socks.  Pt states he has several pairs of crocs that he plans to use instead of socks at home.  Pt also states that he has a loofah on a long handle at home for LB bathing.  Recommended pt check with Dr Yetta Barre on whether he can stand in shower without brace or whether he needs to sit on shower chair.  Pt verbalized that he will ask Dr Yetta Barre for clarification.    OT Diagnosis:    OT Problem List:   OT Treatment Interventions:     OT Goals(current goals can now be found in the care plan section) Acute Rehab OT Goals Patient Stated Goal: Home over the weekend OT Goal Formulation: With patient Time For Goal Achievement: 07/11/13 Potential to Achieve Goals: Good ADL Goals Pt Will Perform Lower Body Dressing: with set-up;with supervision;with adaptive equipment;sit to/from stand Pt Will Transfer to Toilet: with supervision;ambulating;grab bars Pt Will Perform Toileting - Clothing Manipulation and hygiene: with modified independence;with adaptive equipment;sit to/from stand Pt Will Perform Tub/Shower Transfer: with supervision;ambulating;shower seat Additional ADL Goal #1: Pt will be Mod I in and OOB for BADLs Additional ADL Goal #2: Pt will be able to state 3/3 back precautions  Visit Information  Last OT Received On: 07/05/13 Assistance Needed: +1 History of Present Illness: Repeat Decompressive lumbar laminectomy L4-5 L5-S1 requiring more work than would be required for a  simple exposure of the disk  for PLIF in order to adequately decompress the neural elements and address the spinal stenosis,Posterior lumbar interbody fusion L4-5 L5-S1 using PEEK interbody cages packed with morcellized allograft and autograft ;Posterior fixation L4-S1 using cortical pedicle screws.;Intertransverse arthrodesis L4-S1 using morcellized autograft and allograft    Subjective Data      Prior Functioning       Cognition  Cognition Arousal/Alertness: Awake/alert Behavior During Therapy: WFL for tasks assessed/performed Overall Cognitive Status: Within Functional Limits for tasks assessed    Mobility  Bed Mobility Bed Mobility: Rolling Right;Right Sidelying to Sit;Sit to Sidelying Right Rolling Right: 5: Supervision Right Sidelying to Sit: 5: Supervision Sitting - Scoot to Edge of Bed: 5: Supervision Details for Bed Mobility Assistance: reminder to maintain back precautions Transfers Transfers: Sit to Stand;Stand to Sit Sit to Stand: 5: Supervision;From bed Stand to Sit: 5: Supervision;To bed Details for Transfer Assistance: reminder for hand placement to rise to stand    Exercises      Balance     End of Session OT - End of Session Equipment Utilized During Treatment: Rolling walker Activity Tolerance: Patient tolerated treatment well Patient left: in bed;with call bell/phone within reach  GO   07/05/2013 Cipriano Mile OTR/L Pager [phone] Office [phone]   Cipriano Mile 07/05/2013, 1:40 PM

## 2013-07-05 NOTE — Progress Notes (Signed)
Patient ID: [name], male   DOB: March 21, 1976, 37 y.o.   MRN: [id number] Appropriate back soreness. No leg pain, some tingling. Good strength. No ride home until 9 pm, so will d/c home tomorrow.

## 2013-07-06 MED ORDER — CYCLOBENZAPRINE HCL 10 MG PO TABS: 10.0000 mg | ORAL_TABLET | Freq: Three times a day (TID) | ORAL | Status: DC | PRN

## 2013-07-06 MED ORDER — OXYCODONE-ACETAMINOPHEN 5-325 MG PO TABS: 1.0000 | ORAL_TABLET | ORAL | Status: DC | PRN

## 2013-07-06 NOTE — Discharge Summary (Signed)
Physician Discharge Summary  Patient ID: [name] MRN: [id number] DOB/AGE: 1976/09/03 36 y.o.  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 *   Discharged Condition: good  Hospital Course: pt admitted on day of surgery  - underwent procedure below  - pt doing well, eating, voiding, ambulating  Consults: None   Treatments: surgery: PROCEDURE:  1. repeat Decompressive lumbar laminectomy L4-5 L5-S1 requiring more work than would be required for a simple exposure of the disk for PLIF in order to adequately decompress the neural elements and address the spinal stenosis  2. Posterior lumbar interbody fusion L4-5 L5-S1 using PEEK interbody cages packed with morcellized allograft and autograft  3. Posterior fixation L4-S1 using cortical pedicle screws.  4. Intertransverse arthrodesis L4-S1 using morcellized autograft and allograft.      Discharge Exam: Blood pressure 107/62, pulse 65, temperature 98.1 F (36.7 C), temperature source Oral, resp. rate 18, SpO2 99.00%. Wound:c/d/i  Disposition: home     Medication List         cyclobenzaprine 10 MG tablet  Commonly known as:  FLEXERIL  Take 1 tablet (10 mg total) by mouth 3 (three) times daily as needed for muscle spasms.     gabapentin 300 MG capsule  Commonly known as:  NEURONTIN  Take 600 mg by mouth 3 (three) times daily.     oxyCODONE-acetaminophen 5-325 MG per tablet  Commonly known as:  PERCOCET/ROXICET  Take 1-2 tablets by mouth every 4 (four) hours as needed.     oxyCODONE-acetaminophen 5-325 MG per tablet  Commonly known as:  PERCOCET/ROXICET  Take 1-2 tablets by  mouth every 6 (six) hours as needed for pain.         Signed: Clydene Fake, MD 07/06/2013, 11:11 AM

## 2013-07-06 NOTE — Progress Notes (Signed)
Physical Therapy Treatment Patient Details Name: [name] MRN: [id number] DOB: 1976/03/26 Today's Date: 07/06/2013 Time: 5638-7564 PT Time Calculation (min): 23 min  PT Assessment / Plan / Recommendation  History of Present Illness Repeat Decompressive lumbar laminectomy L4-5 L5-S1 requiring more work than would be required for a simple exposure of the disk for PLIF in order to adequately decompress the neural elements and address the spinal stenosis,Posterior lumbar interbody fusion L4-5 L5-S1 using PEEK interbody cages packed with morcellized allograft and autograft ;Posterior fixation L4-S1 using cortical pedicle screws.;Intertransverse arthrodesis L4-S1 using morcellized autograft and allograft   PT Comments   Good progress; Able to safely dc home today from PT standpoint  Follow Up Recommendations  No PT follow up;Supervision/Assistance - 24 hour     Does the patient have the potential to tolerate intense rehabilitation     Barriers to Discharge        Equipment Recommendations  Rolling walker with 5" wheels    Recommendations for Other Services    Frequency Min 6X/week   Progress towards PT Goals Progress towards PT goals: Progressing toward goals  Plan Current plan remains appropriate    Precautions / Restrictions Precautions Precautions: Back Precaution Comments: Able to correctly verbalize and utilize 3/3 back prec Required Braces or Orthoses: Spinal Brace Spinal Brace: Lumbar corset;Applied in sitting position Restrictions Weight Bearing Restrictions: No   Pertinent Vitals/Pain Some back pain; did not rate Pain did not interfere with mobiltiy    Mobility  Bed Mobility Bed Mobility: Rolling Right;Right Sidelying to Sit;Sitting - Scoot to Edge of Bed Rolling Right: 6: Modified independent (Device/Increase time) Sitting - Scoot to Edge of Bed: 6: Modified independent (Device/Increase time) Details for Bed Mobility Assistance: maintained back  precautions Transfers Transfers: Sit to Stand;Stand to Sit Sit to Stand: 5: Supervision;With upper extremity assist;From bed Stand to Sit: 5: Supervision;To chair/3-in-1;With upper extremity assist;With armrests Details for Transfer Assistance: reminder for hand placement to rise to stand Ambulation/Gait Ambulation/Gait Assistance: 5: Supervision;6: Modified independent (Device/Increase time) Ambulation Distance (Feet): 520 Feet Assistive device: Rolling walker Ambulation/Gait Assistance Details: Progressed from Supervision to Modified independent; Smooth progression of RW Gait Pattern: Step-through pattern Stairs: No    Exercises     PT Diagnosis:    PT Problem List:   PT Treatment Interventions:     PT Goals (current goals can now be found in the care plan section) Acute Rehab PT Goals Patient Stated Goal: Home over the weekend Time For Goal Achievement: 07/11/13 Potential to Achieve Goals: Good  Visit Information  Last PT Received On: 07/06/13 Assistance Needed: +1 History of Present Illness: Repeat Decompressive lumbar laminectomy L4-5 L5-S1 requiring more work than would be required for a simple exposure of the disk for PLIF in order to adequately decompress the neural elements and address the spinal stenosis,Posterior lumbar interbody fusion L4-5 L5-S1 using PEEK interbody cages packed with morcellized allograft and autograft ;Posterior fixation L4-S1 using cortical pedicle screws.;Intertransverse arthrodesis L4-S1 using morcellized autograft and allograft    Subjective Data  Subjective: Hopes to dc today Patient Stated Goal: Home over the weekend   Cognition  Cognition Arousal/Alertness: Awake/alert Behavior During Therapy: WFL for tasks assessed/performed Overall Cognitive Status: Within Functional Limits for tasks assessed    Balance     End of Session PT - End of Session Equipment Utilized During Treatment: Back brace Activity Tolerance: Patient tolerated  treatment well Patient left: with call bell/phone within reach;Other (comment) (in bathroom on BSC in front of sink in prep  for washing up) Nurse Communication: Mobility status   GP     Van Clines [facility], [place] [phone]  07/06/2013, 9:53 AM

## 2013-10-24 ENCOUNTER — Other Ambulatory Visit: Payer: Self-pay | Admitting: Neurological Surgery

## 2013-10-24 DIAGNOSIS — M549 Dorsalgia, unspecified: Secondary | ICD-10-CM

## 2013-10-29 ENCOUNTER — Ambulatory Visit
Admission: RE | Admit: 2013-10-29 | Discharge: 2013-10-29 | Disposition: A | Discharge status: Home / Self Care | Payer: Managed Care, Other (non HMO) | Source: Ambulatory Visit | Attending: Neurological Surgery | Admitting: Neurological Surgery

## 2013-10-29 DIAGNOSIS — M549 Dorsalgia, unspecified: Secondary | ICD-10-CM

## 2014-07-03 ENCOUNTER — Other Ambulatory Visit: Payer: Self-pay | Admitting: Neurological Surgery

## 2014-07-03 DIAGNOSIS — M545 Low back pain: Secondary | ICD-10-CM

## 2014-07-09 ENCOUNTER — Ambulatory Visit
Admission: RE | Admit: 2014-07-09 | Discharge: 2014-07-09 | Disposition: A | Discharge status: Home / Self Care | Payer: Managed Care, Other (non HMO) | Source: Ambulatory Visit | Attending: Neurological Surgery | Admitting: Neurological Surgery

## 2014-07-09 DIAGNOSIS — M545 Low back pain: Secondary | ICD-10-CM

## 2014-12-17 ENCOUNTER — Other Ambulatory Visit: Payer: Self-pay | Admitting: Neurological Surgery

## 2014-12-17 DIAGNOSIS — M545 Low back pain: Secondary | ICD-10-CM

## 2014-12-21 ENCOUNTER — Ambulatory Visit
Admission: RE | Admit: 2014-12-21 | Discharge: 2014-12-21 | Disposition: A | Discharge status: Home / Self Care | Payer: BLUE CROSS/BLUE SHIELD | Source: Ambulatory Visit | Attending: Neurological Surgery | Admitting: Neurological Surgery

## 2014-12-21 DIAGNOSIS — M545 Low back pain: Secondary | ICD-10-CM

## 2015-02-09 ENCOUNTER — Other Ambulatory Visit: Payer: Self-pay | Admitting: Neurological Surgery

## 2015-03-10 ENCOUNTER — Encounter (HOSPITAL_COMMUNITY): Payer: Self-pay

## 2015-03-10 ENCOUNTER — Encounter (HOSPITAL_COMMUNITY)
Admission: RE | Admit: 2015-03-10 | Discharge: 2015-03-10 | Disposition: A | Discharge status: Home / Self Care | Payer: BLUE CROSS/BLUE SHIELD | Source: Ambulatory Visit | Attending: Neurological Surgery | Admitting: Neurological Surgery

## 2015-03-10 ENCOUNTER — Ambulatory Visit (HOSPITAL_COMMUNITY)
Admission: RE | Admit: 2015-03-10 | Discharge: 2015-03-10 | Disposition: A | Discharge status: Home / Self Care | Payer: BLUE CROSS/BLUE SHIELD | Source: Ambulatory Visit | Attending: Neurological Surgery | Admitting: Neurological Surgery

## 2015-03-10 DIAGNOSIS — J40 Bronchitis, not specified as acute or chronic: Secondary | ICD-10-CM | POA: Insufficient documentation

## 2015-03-10 DIAGNOSIS — Z01812 Encounter for preprocedural laboratory examination: Secondary | ICD-10-CM | POA: Insufficient documentation

## 2015-03-10 DIAGNOSIS — M545 Low back pain, unspecified: Secondary | ICD-10-CM

## 2015-03-10 DIAGNOSIS — Z01810 Encounter for preprocedural cardiovascular examination: Secondary | ICD-10-CM | POA: Insufficient documentation

## 2015-03-10 DIAGNOSIS — Z01818 Encounter for other preprocedural examination: Secondary | ICD-10-CM | POA: Insufficient documentation

## 2015-03-10 HISTORY — DX: Headache: R51

## 2015-03-10 HISTORY — DX: Psoriasis, unspecified: L40.9

## 2015-03-10 HISTORY — DX: Personal history of other diseases of the respiratory system: Z87.09

## 2015-03-10 HISTORY — DX: Other muscle spasm: M62.838

## 2015-03-10 HISTORY — DX: Other chronic pain: G89.29

## 2015-03-10 HISTORY — DX: Headache, unspecified: R51.9

## 2015-03-10 HISTORY — DX: Polyneuropathy, unspecified: G62.9

## 2015-03-10 HISTORY — DX: Dorsalgia, unspecified: M54.9

## 2015-03-10 HISTORY — DX: Constipation, unspecified: K59.00

## 2015-03-10 LAB — BASIC METABOLIC PANEL
Anion gap: 7 (ref 5–15)
BUN: 14 mg/dL (ref 6–20)
CHLORIDE: 103 mmol/L (ref 101–111)
CO2: 30 mmol/L (ref 22–32)
CREATININE: 0.98 mg/dL (ref 0.61–1.24)
Calcium: 9.6 mg/dL (ref 8.9–10.3)
GFR calc Af Amer: >60 mL/min (ref >60)
GLUCOSE: 98 mg/dL (ref 65–99)
Potassium: 4.4 mmol/L (ref 3.5–5.1)
SODIUM: 140 mmol/L (ref 135–145)

## 2015-03-10 LAB — CBC WITH DIFFERENTIAL/PLATELET
BASOS ABS: 0.1 10*3/uL (ref 0.0–0.1)
Basophils Relative: 1 % (ref 0–1)
Eosinophils Absolute: 0.3 10*3/uL (ref 0.0–0.7)
Eosinophils Relative: 4 % (ref 0–5)
HCT: 41.4 % (ref 39.0–52.0)
Hemoglobin: 14.6 g/dL (ref 13.0–17.0)
LYMPHS PCT: 39 % (ref 12–46)
Lymphs Abs: 2.6 10*3/uL (ref 0.7–4.0)
MCH: 30.5 pg (ref 26.0–34.0)
MCHC: 35.3 g/dL (ref 30.0–36.0)
MCV: 86.4 fL (ref 78.0–100.0)
Monocytes Absolute: 0.5 10*3/uL (ref 0.1–1.0)
Monocytes Relative: 8 % (ref 3–12)
NEUTROS ABS: 3.3 10*3/uL (ref 1.7–7.7)
Neutrophils Relative %: 48 % (ref 43–77)
PLATELETS: 233 10*3/uL (ref 150–400)
RBC: 4.79 MIL/uL (ref 4.22–5.81)
RDW: 12.5 % (ref 11.5–15.5)
WBC: 6.7 10*3/uL (ref 4.0–10.5)

## 2015-03-10 LAB — SURGICAL PCR SCREEN
MRSA, PCR: NEGATIVE
Staphylococcus aureus: NEGATIVE

## 2015-03-10 LAB — PROTIME-INR
INR: 1 (ref 0.00–1.49)
Prothrombin Time: 13.4 seconds (ref 11.6–15.2)

## 2015-03-10 NOTE — Progress Notes (Signed)
   03/10/15 0927  OBSTRUCTIVE SLEEP APNEA  Have you ever been diagnosed with sleep apnea through a sleep study? No  Do you snore loudly (loud enough to be heard through closed doors)?  1  Do you often feel tired, fatigued, or sleepy during the daytime? 0  Has anyone observed you stop breathing during your sleep? 1  Do you have, or are you being treated for high blood pressure? 0  BMI more than 35 kg/m2? 0  Age over 934 years old? 0  Neck circumference greater than 40 cm/16 inches? 1 (17)  Gender: 1

## 2015-03-10 NOTE — Pre-Procedure Instructions (Signed)
Milana KidneyRobert Edrington  03/10/2015      [facility] - [place], [place] - [address] [place] [address] Phone: [phone] Fax: [phone]  [facility] [address] - [place], [place] - [id number] [place] [id number] Phone: [phone] Fax: [phone]    Your procedure is scheduled on Wed, July 13 @ 9:30 AM  [facility] Admitting at 7:30 AM  Call this number if you have problems the morning of surgery:  [phone]   Remember:  Do not eat food or drink liquids after midnight.  Take these medicines the morning of surgery with A SIP OF WATER Gabapentin(Neurontin) and Pain Pill(if needed)               No Goody's,BC's,Aleve,Aspirin,Ibuprofen,Fish Oil,or any Herbal Medications.    Do not wear jewelry.  Do not wear lotions, powders, or colognes.  You may wear deodorant.  Men may shave face and neck.  Do not bring valuables to the hospital.  [facility] is not responsible for any belongings or valuables.  Contacts, dentures or bridgework may not be worn into surgery.  Leave your suitcase in the car.  After surgery it may be brought to your room.  For patients admitted to the hospital, discharge time will be determined by your treatment team.  Patients discharged the day of surgery will not be allowed to drive home.    Special instructions:  [place] - Preparing for Surgery  Before surgery, you can play an important role.  Because skin is not sterile, your skin needs to be as free of germs as possible.  You can reduce the number of germs on you skin by washing with CHG (chlorahexidine gluconate) soap before surgery.  CHG is an antiseptic cleaner which kills germs and bonds with the skin to continue killing germs even after washing.  Please DO NOT use if you have an allergy to CHG or antibacterial soaps.  If your skin becomes reddened/irritated stop using the CHG and inform your nurse when  you arrive at Short Stay.  Do not shave (including legs and underarms) for at least 48 hours prior to the first CHG shower.  You may shave your face.  Please follow these instructions carefully:   1.  Shower with CHG Soap the night before surgery and the                                morning of Surgery.  2.  If you choose to wash your hair, wash your hair first as usual with your       normal shampoo.  3.  After you shampoo, rinse your hair and body thoroughly to remove the                      Shampoo.  4.  Use CHG as you would any other liquid soap.  You can apply chg directly       to the skin and wash gently with scrungie or a clean washcloth.  5.  Apply the CHG Soap to your body ONLY FROM THE NECK DOWN.        Do not use on open wounds or open sores.  Avoid contact with your eyes,       ears, mouth and genitals (private parts).  Wash genitals (private parts)       with  your normal soap.  6.  Wash thoroughly, paying special attention to the area where your surgery        will be performed.  7.  Thoroughly rinse your body with warm water from the neck down.  8.  DO NOT shower/wash with your normal soap after using and rinsing off       the CHG Soap.  9.  Pat yourself dry with a clean towel.            10.  Wear clean pajamas.            11.  Place clean sheets on your bed the night of your first shower and do not        sleep with pets.  Day of Surgery  Do not apply any lotions/deoderants the morning of surgery.  Please wear clean clothes to the hospital/surgery center.    Please read over the following fact sheets that you were given. Pain Booklet, Coughing and Deep Breathing, MRSA Information and Surgical Site Infection Prevention

## 2015-03-10 NOTE — Progress Notes (Signed)
Pt doesn't have a cardiologist  Denies ever having an echo/stress test/heart cath  Medical Md is Dr.Thomas Archie BalboaJolly   Denies EKG or CXR in past yr

## 2015-03-16 MED ORDER — CEFAZOLIN SODIUM-DEXTROSE 2-3 GM-% IV SOLR
2.0000 g | INTRAVENOUS | Status: AC
  Administered 2015-03-17: 2 g via INTRAVENOUS
  Filled 2015-03-16: qty 50

## 2015-03-16 MED ORDER — DEXAMETHASONE SODIUM PHOSPHATE 10 MG/ML IJ SOLN
10.0000 mg | INTRAMUSCULAR | Status: AC
  Administered 2015-03-17: 10 mg via INTRAVENOUS
  Filled 2015-03-16: qty 1

## 2015-03-17 ENCOUNTER — Encounter (HOSPITAL_COMMUNITY)
Admission: RE | Disposition: A | Discharge status: Home / Self Care | Payer: Self-pay | Source: Ambulatory Visit | Attending: Neurological Surgery

## 2015-03-17 ENCOUNTER — Ambulatory Visit (HOSPITAL_COMMUNITY): Payer: BLUE CROSS/BLUE SHIELD | Admitting: Certified Registered"

## 2015-03-17 ENCOUNTER — Ambulatory Visit (HOSPITAL_COMMUNITY)
Admission: RE | Admit: 2015-03-17 | Discharge: 2015-03-18 | Disposition: A | Discharge status: Home / Self Care | Payer: BLUE CROSS/BLUE SHIELD | Source: Ambulatory Visit | Attending: Neurological Surgery | Admitting: Neurological Surgery

## 2015-03-17 ENCOUNTER — Encounter (HOSPITAL_COMMUNITY): Payer: Self-pay | Admitting: *Deleted

## 2015-03-17 DIAGNOSIS — Y831 Surgical operation with implant of artificial internal device as the cause of abnormal reaction of the patient, or of later complication, without mention of misadventure at the time of the procedure: Secondary | ICD-10-CM | POA: Diagnosis not present

## 2015-03-17 DIAGNOSIS — Z981 Arthrodesis status: Secondary | ICD-10-CM

## 2015-03-17 DIAGNOSIS — T8484XA Pain due to internal orthopedic prosthetic devices, implants and grafts, initial encounter: Secondary | ICD-10-CM | POA: Insufficient documentation

## 2015-03-17 DIAGNOSIS — M4806 Spinal stenosis, lumbar region: Secondary | ICD-10-CM | POA: Insufficient documentation

## 2015-03-17 HISTORY — PX: LUMBAR LAMINECTOMY/DECOMPRESSION MICRODISCECTOMY: SHX5026

## 2015-03-17 SURGERY — LUMBAR LAMINECTOMY/DECOMPRESSION MICRODISCECTOMY 1 LEVEL
Anesthesia: General | Site: Back

## 2015-03-17 MED ORDER — THROMBIN 5000 UNITS EX SOLR
CUTANEOUS | Status: DC | PRN
  Administered 2015-03-17 (×2): 5000 [IU] via TOPICAL

## 2015-03-17 MED ORDER — LACTATED RINGERS IV SOLN
INTRAVENOUS | Status: DC
  Administered 2015-03-17 (×2): via INTRAVENOUS

## 2015-03-17 MED ORDER — HYDROMORPHONE HCL 1 MG/ML IJ SOLN
INTRAMUSCULAR | Status: AC
  Filled 2015-03-17: qty 1

## 2015-03-17 MED ORDER — ROCURONIUM BROMIDE 100 MG/10ML IV SOLN
INTRAVENOUS | Status: DC | PRN
Start: 2015-03-17 — End: 2015-03-17
  Administered 2015-03-17: 50 mg via INTRAVENOUS
  Administered 2015-03-17: 20 mg via INTRAVENOUS
  Administered 2015-03-17: 10 mg via INTRAVENOUS
  Administered 2015-03-17: 20 mg via INTRAVENOUS

## 2015-03-17 MED ORDER — SODIUM CHLORIDE 0.9 % IJ SOLN
3.0000 mL | Freq: Two times a day (BID) | INTRAMUSCULAR | Status: DC
  Administered 2015-03-17: 3 mL via INTRAVENOUS

## 2015-03-17 MED ORDER — MENTHOL 3 MG MT LOZG: 1.0000 | LOZENGE | OROMUCOSAL | Status: DC | PRN

## 2015-03-17 MED ORDER — OXYCODONE HCL 5 MG PO TABS
ORAL_TABLET | ORAL | Status: AC
  Filled 2015-03-17: qty 2

## 2015-03-17 MED ORDER — BUPIVACAINE HCL (PF) 0.25 % IJ SOLN
INTRAMUSCULAR | Status: DC | PRN
  Administered 2015-03-17: 20 mL

## 2015-03-17 MED ORDER — EPHEDRINE SULFATE 50 MG/ML IJ SOLN
INTRAMUSCULAR | Status: AC
  Filled 2015-03-17: qty 1

## 2015-03-17 MED ORDER — POTASSIUM CHLORIDE IN NACL 20-0.9 MEQ/L-% IV SOLN
INTRAVENOUS | Status: DC
  Administered 2015-03-17: 16:00:00 via INTRAVENOUS
  Filled 2015-03-17 (×4): qty 1000

## 2015-03-17 MED ORDER — ACETAMINOPHEN 650 MG RE SUPP: 650.0000 mg | RECTAL | Status: DC | PRN

## 2015-03-17 MED ORDER — GLYCOPYRROLATE 0.2 MG/ML IJ SOLN
INTRAMUSCULAR | Status: DC | PRN
  Administered 2015-03-17: .8 mg via INTRAVENOUS

## 2015-03-17 MED ORDER — SODIUM CHLORIDE 0.9 % IJ SOLN
INTRAMUSCULAR | Status: AC
  Filled 2015-03-17: qty 10

## 2015-03-17 MED ORDER — MEPERIDINE HCL 25 MG/ML IJ SOLN: 6.2500 mg | INTRAMUSCULAR | Status: DC | PRN

## 2015-03-17 MED ORDER — CEFAZOLIN SODIUM 1-5 GM-% IV SOLN
1.0000 g | Freq: Three times a day (TID) | INTRAVENOUS | Status: AC
  Administered 2015-03-17 – 2015-03-18 (×2): 1 g via INTRAVENOUS
  Filled 2015-03-17 (×2): qty 50

## 2015-03-17 MED ORDER — PROPOFOL 10 MG/ML IV BOLUS
INTRAVENOUS | Status: AC
Start: 2015-03-17 — End: 2015-03-17
  Filled 2015-03-17: qty 20

## 2015-03-17 MED ORDER — MIDAZOLAM HCL 2 MG/2ML IJ SOLN
INTRAMUSCULAR | Status: AC
  Filled 2015-03-17: qty 2

## 2015-03-17 MED ORDER — ONDANSETRON HCL 4 MG/2ML IJ SOLN
INTRAMUSCULAR | Status: AC
  Filled 2015-03-17: qty 2

## 2015-03-17 MED ORDER — NEOSTIGMINE METHYLSULFATE 10 MG/10ML IV SOLN
INTRAVENOUS | Status: AC
  Filled 2015-03-17: qty 1

## 2015-03-17 MED ORDER — ARTIFICIAL TEARS OP OINT
TOPICAL_OINTMENT | OPHTHALMIC | Status: DC | PRN
  Administered 2015-03-17: 1 via OPHTHALMIC

## 2015-03-17 MED ORDER — MORPHINE SULFATE ER 30 MG PO TBCR
30.0000 mg | EXTENDED_RELEASE_TABLET | Freq: Two times a day (BID) | ORAL | Status: DC
  Administered 2015-03-17 – 2015-03-18 (×2): 30 mg via ORAL
  Filled 2015-03-17 (×2): qty 1

## 2015-03-17 MED ORDER — NEOSTIGMINE METHYLSULFATE 10 MG/10ML IV SOLN
INTRAVENOUS | Status: DC | PRN
  Administered 2015-03-17: 5 mg via INTRAVENOUS

## 2015-03-17 MED ORDER — LIDOCAINE HCL (CARDIAC) 20 MG/ML IV SOLN
INTRAVENOUS | Status: AC
  Filled 2015-03-17: qty 5

## 2015-03-17 MED ORDER — ROCURONIUM BROMIDE 50 MG/5ML IV SOLN
INTRAVENOUS | Status: AC
Start: 2015-03-17 — End: 2015-03-17
  Filled 2015-03-17: qty 1

## 2015-03-17 MED ORDER — 0.9 % SODIUM CHLORIDE (POUR BTL) OPTIME
TOPICAL | Status: DC | PRN
Start: 2015-03-17 — End: 2015-03-17
  Administered 2015-03-17: 1000 mL

## 2015-03-17 MED ORDER — PHENOL 1.4 % MT LIQD: 1.0000 | OROMUCOSAL | Status: DC | PRN

## 2015-03-17 MED ORDER — MIDAZOLAM HCL 5 MG/5ML IJ SOLN
INTRAMUSCULAR | Status: DC | PRN
  Administered 2015-03-17: 2 mg via INTRAVENOUS

## 2015-03-17 MED ORDER — GLYCOPYRROLATE 0.2 MG/ML IJ SOLN
INTRAMUSCULAR | Status: AC
  Filled 2015-03-17: qty 4

## 2015-03-17 MED ORDER — ARTIFICIAL TEARS OP OINT
TOPICAL_OINTMENT | OPHTHALMIC | Status: AC
  Filled 2015-03-17: qty 3.5

## 2015-03-17 MED ORDER — TIZANIDINE HCL 4 MG PO TABS
4.0000 mg | ORAL_TABLET | Freq: Three times a day (TID) | ORAL | Status: DC
  Administered 2015-03-17 – 2015-03-18 (×4): 4 mg via ORAL
  Filled 2015-03-17 (×5): qty 1

## 2015-03-17 MED ORDER — LIDOCAINE HCL (CARDIAC) 20 MG/ML IV SOLN
INTRAVENOUS | Status: DC | PRN
  Administered 2015-03-17: 30 mg via INTRAVENOUS

## 2015-03-17 MED ORDER — SODIUM CHLORIDE 0.9 % IV SOLN: 250.0000 mL | INTRAVENOUS | Status: DC

## 2015-03-17 MED ORDER — HYDROMORPHONE HCL 1 MG/ML IJ SOLN
INTRAMUSCULAR | Status: DC | PRN
  Administered 2015-03-17 (×2): 0.5 mg via INTRAVENOUS

## 2015-03-17 MED ORDER — MORPHINE SULFATE 2 MG/ML IJ SOLN
1.0000 mg | INTRAMUSCULAR | Status: DC | PRN
  Administered 2015-03-17 – 2015-03-18 (×2): 4 mg via INTRAVENOUS
  Filled 2015-03-17 (×3): qty 2

## 2015-03-17 MED ORDER — FENTANYL CITRATE (PF) 250 MCG/5ML IJ SOLN
INTRAMUSCULAR | Status: AC
  Filled 2015-03-17: qty 5

## 2015-03-17 MED ORDER — SUCCINYLCHOLINE CHLORIDE 20 MG/ML IJ SOLN
INTRAMUSCULAR | Status: AC
  Filled 2015-03-17: qty 1

## 2015-03-17 MED ORDER — OXYCODONE HCL 5 MG PO TABS
5.0000 mg | ORAL_TABLET | Freq: Four times a day (QID) | ORAL | Status: DC | PRN
  Administered 2015-03-17 – 2015-03-18 (×3): 10 mg via ORAL
  Filled 2015-03-17 (×3): qty 2

## 2015-03-17 MED ORDER — GABAPENTIN 600 MG PO TABS
600.0000 mg | ORAL_TABLET | Freq: Three times a day (TID) | ORAL | Status: DC
  Administered 2015-03-17 – 2015-03-18 (×3): 600 mg via ORAL
  Filled 2015-03-17 (×5): qty 1

## 2015-03-17 MED ORDER — FENTANYL CITRATE (PF) 100 MCG/2ML IJ SOLN
INTRAMUSCULAR | Status: DC | PRN
  Administered 2015-03-17: 50 ug via INTRAVENOUS
  Administered 2015-03-17: 200 ug via INTRAVENOUS

## 2015-03-17 MED ORDER — MORPHINE SULFATE ER 30 MG PO TBCR: 30.0000 mg | EXTENDED_RELEASE_TABLET | Freq: Two times a day (BID) | ORAL | Status: DC

## 2015-03-17 MED ORDER — PROPOFOL 10 MG/ML IV BOLUS
INTRAVENOUS | Status: DC | PRN
  Administered 2015-03-17: 200 mg via INTRAVENOUS

## 2015-03-17 MED ORDER — HYDROMORPHONE HCL 1 MG/ML IJ SOLN
0.2500 mg | INTRAMUSCULAR | Status: DC | PRN
  Administered 2015-03-17 (×4): 0.5 mg via INTRAVENOUS

## 2015-03-17 MED ORDER — ONDANSETRON HCL 4 MG/2ML IJ SOLN
INTRAMUSCULAR | Status: DC | PRN
  Administered 2015-03-17: 4 mg via INTRAVENOUS

## 2015-03-17 MED ORDER — ACETAMINOPHEN 325 MG PO TABS: 650.0000 mg | ORAL_TABLET | ORAL | Status: DC | PRN

## 2015-03-17 MED ORDER — DOCUSATE SODIUM 100 MG PO CAPS
300.0000 mg | ORAL_CAPSULE | Freq: Every day | ORAL | Status: DC
  Administered 2015-03-18: 300 mg via ORAL
  Filled 2015-03-17: qty 3

## 2015-03-17 MED ORDER — BACITRACIN 50000 UNITS IM SOLR
INTRAMUSCULAR | Status: DC | PRN
  Administered 2015-03-17: 500 mL

## 2015-03-17 MED ORDER — MIDAZOLAM HCL 2 MG/2ML IJ SOLN
0.5000 mg | Freq: Once | INTRAMUSCULAR | Status: AC | PRN
  Administered 2015-03-17: 2 mg via INTRAVENOUS

## 2015-03-17 MED ORDER — PROMETHAZINE HCL 25 MG/ML IJ SOLN: 6.2500 mg | INTRAMUSCULAR | Status: DC | PRN

## 2015-03-17 MED ORDER — GABAPENTIN 300 MG PO CAPS
600.0000 mg | ORAL_CAPSULE | Freq: Three times a day (TID) | ORAL | Status: DC
  Filled 2015-03-17 (×2): qty 2

## 2015-03-17 MED ORDER — SODIUM CHLORIDE 0.9 % IJ SOLN: 3.0000 mL | INTRAMUSCULAR | Status: DC | PRN

## 2015-03-17 MED ORDER — ROCURONIUM BROMIDE 50 MG/5ML IV SOLN
INTRAVENOUS | Status: AC
  Filled 2015-03-17: qty 1

## 2015-03-17 MED ORDER — ONDANSETRON HCL 4 MG/2ML IJ SOLN: 4.0000 mg | INTRAMUSCULAR | Status: DC | PRN

## 2015-03-17 MED ORDER — HEMOSTATIC AGENTS (NO CHARGE) OPTIME
TOPICAL | Status: DC | PRN
  Administered 2015-03-17: 1 via TOPICAL

## 2015-03-17 SURGICAL SUPPLY — 45 items
BAG DECANTER FOR FLEXI CONT (MISCELLANEOUS) ×3 IMPLANT
BENZOIN TINCTURE PRP APPL 2/3 (GAUZE/BANDAGES/DRESSINGS) ×3 IMPLANT
BUR MATCHSTICK NEURO 3.0 LAGG (BURR) ×3 IMPLANT
CANISTER SUCT 3000ML PPV (MISCELLANEOUS) ×3 IMPLANT
CLOSURE WOUND 1/2 X4 (GAUZE/BANDAGES/DRESSINGS) ×1
CONT SPEC 4OZ CLIKSEAL STRL BL (MISCELLANEOUS) ×3 IMPLANT
DRAPE LAPAROTOMY 100X72X124 (DRAPES) ×3 IMPLANT
DRAPE MICROSCOPE LEICA (MISCELLANEOUS) ×3 IMPLANT
DRAPE POUCH INSTRU U-SHP 10X18 (DRAPES) ×3 IMPLANT
DRAPE SURG 17X23 STRL (DRAPES) ×3 IMPLANT
DRSG OPSITE 4X5.5 SM (GAUZE/BANDAGES/DRESSINGS) ×3 IMPLANT
DRSG OPSITE POSTOP 4X6 (GAUZE/BANDAGES/DRESSINGS) ×3 IMPLANT
DRSG TELFA 3X8 NADH (GAUZE/BANDAGES/DRESSINGS) ×3 IMPLANT
DURAPREP 26ML APPLICATOR (WOUND CARE) ×3 IMPLANT
ELECT REM PT RETURN 9FT ADLT (ELECTROSURGICAL) ×3
ELECTRODE REM PT RTRN 9FT ADLT (ELECTROSURGICAL) ×1 IMPLANT
GAUZE SPONGE 4X4 16PLY XRAY LF (GAUZE/BANDAGES/DRESSINGS) IMPLANT
GLOVE BIO SURGEON STRL SZ8 (GLOVE) ×3 IMPLANT
GOWN STRL REUS W/ TWL LRG LVL3 (GOWN DISPOSABLE) IMPLANT
GOWN STRL REUS W/ TWL XL LVL3 (GOWN DISPOSABLE) ×1 IMPLANT
GOWN STRL REUS W/TWL 2XL LVL3 (GOWN DISPOSABLE) IMPLANT
GOWN STRL REUS W/TWL LRG LVL3 (GOWN DISPOSABLE)
GOWN STRL REUS W/TWL XL LVL3 (GOWN DISPOSABLE) ×2
HEMOSTAT POWDER KIT SURGIFOAM (HEMOSTASIS) IMPLANT
KIT BASIN OR (CUSTOM PROCEDURE TRAY) ×3 IMPLANT
KIT ROOM TURNOVER OR (KITS) ×3 IMPLANT
NEEDLE ASP BONE MRW 8GX15 (NEEDLE) ×3 IMPLANT
NEEDLE HYPO 25X1 1.5 SAFETY (NEEDLE) ×3 IMPLANT
NEEDLE SPNL 20GX3.5 QUINCKE YW (NEEDLE) IMPLANT
NS IRRIG 1000ML POUR BTL (IV SOLUTION) ×3 IMPLANT
PACK LAMINECTOMY NEURO (CUSTOM PROCEDURE TRAY) ×3 IMPLANT
PAD ARMBOARD 7.5X6 YLW CONV (MISCELLANEOUS) ×9 IMPLANT
RUBBERBAND STERILE (MISCELLANEOUS) ×6 IMPLANT
SPONGE SURGIFOAM ABS GEL SZ50 (HEMOSTASIS) ×3 IMPLANT
STRIP BIOACTIVE VITOSS 25X100X (Neuro Prosthesis/Implant) ×3 IMPLANT
STRIP CLOSURE SKIN 1/2X4 (GAUZE/BANDAGES/DRESSINGS) ×2 IMPLANT
SUT VIC AB 0 CT1 18XCR BRD8 (SUTURE) ×1 IMPLANT
SUT VIC AB 0 CT1 8-18 (SUTURE) ×2
SUT VIC AB 2-0 CP2 18 (SUTURE) ×3 IMPLANT
SUT VIC AB 3-0 SH 8-18 (SUTURE) ×3 IMPLANT
SYR 20ML ECCENTRIC (SYRINGE) ×3 IMPLANT
TAPE STRIPS DRAPE STRL (GAUZE/BANDAGES/DRESSINGS) ×3 IMPLANT
TOWEL OR 17X24 6PK STRL BLUE (TOWEL DISPOSABLE) ×3 IMPLANT
TOWEL OR 17X26 10 PK STRL BLUE (TOWEL DISPOSABLE) ×3 IMPLANT
WATER STERILE IRR 1000ML POUR (IV SOLUTION) ×3 IMPLANT

## 2015-03-17 NOTE — Anesthesia Preprocedure Evaluation (Addendum)
Anesthesia Evaluation  Patient identified by MRN, date of birth, ID band Patient awake    Reviewed: Allergy & Precautions, NPO status , Patient's Chart, lab work & pertinent test results  History of Anesthesia Complications Negative for: history of anesthetic complications  Airway Mallampati: I  TM Distance: >3 FB Neck ROM: Full    Dental  (+) Teeth Intact, Dental Advisory Given, Chipped,    Pulmonary neg pulmonary ROS,  breath sounds clear to auscultation        Cardiovascular negative cardio ROS  Rhythm:Regular Rate:Normal     Neuro/Psych  Headaches, Chronic back pain: narcotics    GI/Hepatic negative GI ROS, Neg liver ROS,   Endo/Other  Morbid obesity  Renal/GU negative Renal ROS     Musculoskeletal   Abdominal (+) + obese,   Peds  Hematology   Anesthesia Other Findings   Reproductive/Obstetrics                         Anesthesia Physical Anesthesia Plan  ASA: II  Anesthesia Plan: General   Post-op Pain Management:    Induction: Intravenous  Airway Management Planned: Oral ETT  Additional Equipment:   Intra-op Plan:   Post-operative Plan: Extubation in OR  Informed Consent: I have reviewed the patients History and Physical, chart, labs and discussed the procedure including the risks, benefits and alternatives for the proposed anesthesia with the patient or authorized representative who has indicated his/her understanding and acceptance.   Dental advisory given  Plan Discussed with: CRNA and Surgeon  Anesthesia Plan Comments: (Plan routine monitors, GETA)        Anesthesia Quick Evaluation

## 2015-03-17 NOTE — H&P (Signed)
Subjective: Patient is a 39 y.o. male admitted for LL and removal of hardware and possible posterior fusion if necessary. Onset of symptoms was several months ago, gradually worsening since that time.  The pain is rated severe, and is located at the across the lower back and radiates to thighs. The pain is described as aching and occurs all day. The symptoms have been progressive. Symptoms are exacerbated by exercise. MRI or CT showed stenosis L3-4, previous fusion L4-S1.   Past Medical History  Diagnosis Date  . Muscle spasm     takes Tizanidine daily as needed  . History of bronchitis 1995  . Headache   . Peripheral neuropathy     takes Gabapentin daily  . Chronic back pain     DDD and stenosis  . Psoriasis   . Constipation     r/t pain meds and takes Colace daily    Past Surgical History  Procedure Laterality Date  . Back surgery  06,11  . Lumbar laminectomy  07/03/2013    L 5  L4 S1        Dr Yetta BarreJones  . Maximum access (mas)posterior lumbar interbody fusion (plif) 2 level N/A 07/03/2013    Procedure: FOR MAXIMUM ACCESS SURGERY POSTERIOR LUMBAR INTERBODY FUSION  LUMBAR FOUR-FIVE ,LUMBAR FIVE-SACRAL ONE;  Surgeon: Tia Alertavid S Rayan Dyal, MD;  Location: [facility];  Service: Neurosurgery;  Laterality: N/A;    Prior to Admission medications   Medication Sig Start Date End Date Taking? Authorizing Provider  docusate sodium (COLACE) 100 MG capsule Take 300 mg by mouth daily.   Yes Historical Provider, MD  gabapentin (NEURONTIN) 300 MG capsule Take 600 mg by mouth 3 (three) times daily.   Yes Historical Provider, MD  morphine (MS CONTIN) 30 MG 12 hr tablet Take 30 mg by mouth every 12 (twelve) hours.   Yes Historical Provider, MD  oxyCODONE (OXY IR/ROXICODONE) 5 MG immediate release tablet Take 5-10 mg by mouth every 6 (six) hours as needed for severe pain.   Yes Historical Provider, MD  tiZANidine (ZANAFLEX) 4 MG tablet Take 4 mg by mouth 3 (three) times daily.   Yes Historical Provider, MD   No  Known Allergies  History  Substance Use Topics  . Smoking status: Never Smoker   . Smokeless tobacco: Never Used     Comment: occ alcohol  . Alcohol Use: No    History reviewed. No pertinent family history.   Review of Systems  Positive ROS: neg  All other systems have been reviewed and were otherwise negative with the exception of those mentioned in the HPI and as above.  Objective: Vital signs in last 24 hours: Temp:  [98.1 F (36.7 C)] 98.1 F (36.7 C) (07/13 0750) Pulse Rate:  [73] 73 (07/13 0750) Resp:  [20] 20 (07/13 0750) BP: (117)/(73) 117/73 mmHg (07/13 0750) SpO2:  [98 %] 98 % (07/13 0750) Weight:  [237 lb (107.502 kg)] 237 lb (107.502 kg) (07/13 0750)  General Appearance: Alert, cooperative, no distress, appears stated age Head: Normocephalic, without obvious abnormality, atraumatic Eyes: PERRL, conjunctiva/corneas clear, EOM's intact    Neck: Supple, symmetrical, trachea midline Back: Symmetric, no curvature, ROM normal, no CVA tenderness Lungs:  respirations unlabored Heart: Regular rate and rhythm Abdomen: Soft, non-tender Extremities: Extremities normal, atraumatic, no cyanosis or edema Pulses: 2+ and symmetric all extremities Skin: Skin color, texture, turgor normal, no rashes or lesions  NEUROLOGIC:   Mental status: Alert and oriented x4,  no aphasia, good attention span, fund  of knowledge, and memory Motor Exam - grossly normal Sensory Exam - grossly normal Reflexes: 1= Coordination - grossly normal Gait - grossly normal Balance - grossly normal Cranial Nerves: I: smell Not tested  II: visual acuity  OS: nl    OD: nl  II: visual fields Full to confrontation  II: pupils Equal, round, reactive to light  III,VII: ptosis None  III,IV,VI: extraocular muscles  Full ROM  V: mastication Normal  V: facial light touch sensation  Normal  V,VII: corneal reflex  Present  VII: facial muscle function - upper  Normal  VII: facial muscle function - lower  Normal  VIII: hearing Not tested  IX: soft palate elevation  Normal  IX,X: gag reflex Present  XI: trapezius strength  5/5  XI: sternocleidomastoid strength 5/5  XI: neck flexion strength  5/5  XII: tongue strength  Normal    Data Review Lab Results  Component Value Date   WBC 6.7 03/10/2015   HGB 14.6 03/10/2015   HCT 41.4 03/10/2015   MCV 86.4 03/10/2015   PLT 233 03/10/2015   Lab Results  Component Value Date   NA 140 03/10/2015   K 4.4 03/10/2015   CL 103 03/10/2015   CO2 30 03/10/2015   BUN 14 03/10/2015   CREATININE 0.98 03/10/2015   GLUCOSE 98 03/10/2015   Lab Results  Component Value Date   INR 1.00 03/10/2015    Assessment/Plan: Patient admitted for LL L34 and removal of hardware. Patient has failed a reasonable attempt at conservative therapy.  I explained the condition and procedure to the patient and answered any questions.  Patient wishes to proceed with procedure as planned. Understands risks/ benefits and typical outcomes of procedure.   Dam Ashraf S 03/17/2015 9:02 AM

## 2015-03-17 NOTE — Progress Notes (Signed)
Doing well post-op, back sore as expected. Good strength, eating well.

## 2015-03-17 NOTE — Anesthesia Procedure Notes (Signed)
Procedure Name: Intubation Date/Time: 03/17/2015 9:23 AM Performed by: De NurseENNIE, Margarit Minshall E Pre-anesthesia Checklist: Patient identified, Emergency Drugs available, Suction available, Patient being monitored and Timeout performed Patient Re-evaluated:Patient Re-evaluated prior to inductionOxygen Delivery Method: Circle system utilized Preoxygenation: Pre-oxygenation with 100% oxygen Intubation Type: IV induction Ventilation: Mask ventilation without difficulty Laryngoscope Size: Mac and 3 Grade View: Grade I Tube type: Oral Tube size: 7.5 mm Number of attempts: 1 Airway Equipment and Method: Stylet Placement Confirmation: ETT inserted through vocal cords under direct vision,  positive ETCO2 and breath sounds checked- equal and bilateral Secured at: 22 cm Tube secured with: Tape Dental Injury: Teeth and Oropharynx as per pre-operative assessment

## 2015-03-17 NOTE — Op Note (Signed)
03/17/2015  11:24 AM  PATIENT:  [name]  39 y.o. 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 Decompressive lumbar hemilaminectomy and medial facetectomy and foraminotomy L3-4 with sublaminar decompression to address spinal stenosis, 2. Removal of segmental instrumentation L4-S1 bilaterally, 3. Exploration of spinal fusion L4-S1 to rule out pseudoarthrosis, 4. Intertransverse arthrodesis L3-L5 on the right utilizing locally harvested morselized autologous bone graft and allograft soaked with a bone marrow aspirate obtained through a separate fascial incision  SURGEON:  Marikay Alaravid Taneasha Fuqua, MD  ASSISTANTS: Dr. Jeral FruitBotero  ANESTHESIA:   General  EBL: 150 ml  Total I/O In: 1000 [I.V.:1000] Out: 150 [Blood:150]  BLOOD ADMINISTERED:none  DRAINS: Medium Hemovac   SPECIMEN:  No Specimen  INDICATION FOR PROCEDURE: This patient underwent a 2 level lumbar fusion over a year ago. He had continued chronic back pain with the development of bilateral anterior thigh pain over time. CT scan showed a solid arthrodesis at L5-S1 with potential for pseudoarthrosis at L4-5 with some lucency around the left L4 pedicle screw and mild to moderate adjacent level stenosis at L3-4. MRI confirmed moderate adjacent level stenosis L3-4. He tried medical management Without relief. I recommended lumbar exploration with removal of possibly painful hardware, exploration of fusion, possible intertransverse arthrodesis if I felt he was not solid, decompressive laminectomy at L3-4 to address his adjacent level stenosis. Patient understood the risks, benefits, and alternatives and potential outcomes and wished to proceed.  PROCEDURE DETAILS: The patient was taken to the operating room and after induction of adequate general endotracheal anesthesia he was rolled into the prone  position on the chest rolls and pressure points were padded. Lumbar region was cleaned and then prepped with DuraPrep and then draped in the usual sterile fashion. His old incision was opened and the paraspinous muscular was taken down in a subperiosteal fashion L3-4 and then the inferior segmental fixation was identified. The locking caps were removed from the  screw heads and the rods were removed. I then pulled on each screw head in succession to see if all moved in unison. They indeed did so. All screws had good purchase except for the left L4 screw which was somewhat loose. All screws were removed. I then dissected in the suprafascial plane on the right to identify the right iliac crest, made a separate fascial incision and used a needle to remove 20 mL of bone marrow aspirate from the right iliac crest. This was soaked on morselized allograft. I then closed this fascia and turned my attention to the decompressive laminectomy at L3-4. I performed a left L3-4 hemi-laminectomy, medial facetectomy, and foraminotomy. The yellow ligament was removed in a piecemeal fashion to expose underlying dura and left L4 nerve root. Undercut the lateral recess. I then used a high-speed drill to drill up under the spinous process as well as the opposite lamina and performed a sublaminar decompression in order to decompress the central canal and right lateral recess. I then palpated with a coronary dilator to assure adequate decompression. I then identified the transverse processes of L3-L4 and L5 on the right, decorticated these, then irrigated with saline solution containing bacitracin. I then placed a mixture of bone marrow soaked allograft and morcellized autograft out over the transverse processes of L3 L4 L5 on the right to perform intertransverse arthrodesis. I inspected my decompression once again and lined the dura with Gelfoam.  I then placed a medium Hemovac drain through a separate stab incision. I closed the muscle  and the fascia with 0 Vicryls. I closed the subcutaneous and subcuticular tissue with 10/08/2003. The skin was closed with benzoin and Steri-Strips. The drapes were removed, a sterile dressing was applied, and the patient was awakened from general anesthesia and transferred to the recovery room in stable condition. At the end of the procedure all sponge needle and instrument counts were correct.  PLAN OF CARE: Admit for overnight observation  PATIENT DISPOSITION:  PACU - hemodynamically stable.   Delay start of Pharmacological VTE agent (>24hrs) due to surgical blood loss or risk of bleeding:  yes

## 2015-03-17 NOTE — Transfer of Care (Signed)
Immediate Anesthesia Transfer of Care Note  Patient: Todd KidneyRobert Smith  Procedure(s) Performed: Procedure(s) with comments: Laminectomy and Foraminotomy - L3-L4, hardware extraction (N/A) - Laminectomy and Foraminotomy - L3-L4, hardware extraction  Patient Location: PACU  Anesthesia Type:General  Level of Consciousness: awake, alert  and oriented  Airway & Oxygen Therapy: Patient Spontanous Breathing and Patient connected to nasal cannula oxygen  Post-op Assessment: Report given to RN  Post vital signs: Reviewed and stable  Last Vitals:  Filed Vitals:   03/17/15 1114  BP:   Pulse:   Temp: 36.7 C  Resp:     Complications: No apparent anesthesia complications

## 2015-03-17 NOTE — Anesthesia Postprocedure Evaluation (Signed)
  Anesthesia Post-op Note  Patient: [name]  Procedure(s) Performed: Procedure(s) with comments: Laminectomy and Foraminotomy - L3-L4, hardware extraction (N/A) - Laminectomy and Foraminotomy - L3-L4, hardware extraction  Patient Location: PACU  Anesthesia Type:General  Level of Consciousness: awake, alert , oriented and patient cooperative  Airway and Oxygen Therapy: Patient Spontanous Breathing and Patient connected to nasal cannula oxygen  Post-op Pain: mild  Post-op Assessment: Post-op Vital signs reviewed, Patient's Cardiovascular Status Stable, Respiratory Function Stable, Patent Airway, No signs of Nausea or vomiting and Pain level controlled              Post-op Vital Signs: Reviewed and stable  Last Vitals:  Filed Vitals:   03/17/15 1215  BP: 132/80  Pulse: 78  Temp:   Resp: 12    Complications: No apparent anesthesia complications

## 2015-03-18 ENCOUNTER — Encounter (HOSPITAL_COMMUNITY): Payer: Self-pay | Admitting: Neurological Surgery

## 2015-03-18 DIAGNOSIS — T8484XA Pain due to internal orthopedic prosthetic devices, implants and grafts, initial encounter: Secondary | ICD-10-CM | POA: Diagnosis not present

## 2015-03-18 MED ORDER — OXYCODONE HCL 5 MG PO TABS
5.0000 mg | ORAL_TABLET | ORAL | Status: DC | PRN
  Administered 2015-03-18 (×2): 10 mg via ORAL
  Filled 2015-03-18 (×2): qty 2

## 2015-03-18 MED ORDER — TIZANIDINE HCL 4 MG PO TABS: 4.0000 mg | ORAL_TABLET | Freq: Three times a day (TID) | ORAL | Status: AC

## 2015-03-18 NOTE — Discharge Summary (Signed)
Physician Discharge Summary  Patient ID: Todd KidneyRobert Smith MRN: [id number] DOB/AGE: 1976/03/01 39 y.o.  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 The patient tolerated the procedure well and was taken to the recovery room and then to the floor in stable condition. The hospital course was routine. There were no complications. The wound remained clean dry and intact. Pt had appropriate back soreness. No complaints of leg pain or new N/T/W. The patient remained afebrile with stable vital signs, and tolerated a regular diet. The patient continued to increase activities, and pain was well controlled with oral pain medications.   Consults: None  Significant Diagnostic Studies:  Results for orders placed or performed during the hospital encounter of 03/10/15  Surgical pcr screen  Result Value Ref Range   MRSA, PCR NEGATIVE NEGATIVE   Staphylococcus aureus NEGATIVE NEGATIVE  Basic metabolic panel  Result Value Ref Range   Sodium 140 135 - 145 mmol/L   Potassium 4.4 3.5 - 5.1 mmol/L   Chloride 103 101 - 111 mmol/L   CO2 30 22 - 32 mmol/L   Glucose, Bld 98 65 - 99 mg/dL   BUN 14 6 - 20 mg/dL   Creatinine, Ser 7.820.98 0.61 - 1.24 mg/dL   Calcium 9.6 8.9 - 95.610.3 mg/dL   GFR calc non Af Amer >60 >60 mL/min   GFR calc Af Amer >60 >60 mL/min   Anion gap 7 5 - 15  CBC WITH DIFFERENTIAL  Result Value Ref Range   WBC 6.7 4.0 - 10.5 K/uL   RBC 4.79 4.22 - 5.81 MIL/uL   Hemoglobin 14.6 13.0 - 17.0 g/dL   HCT 21.341.4 08.639.0 - 57.852.0 %   MCV 86.4 78.0 - 100.0 fL   MCH 30.5 26.0 - 34.0 pg   MCHC 35.3 30.0 - 36.0 g/dL   RDW 46.912.5 62.911.5 - 52.815.5 %   Platelets 233 150 - 400 K/uL   Neutrophils  Relative % 48 43 - 77 %   Neutro Abs 3.3 1.7 - 7.7 K/uL   Lymphocytes Relative 39 12 - 46 %   Lymphs Abs 2.6 0.7 - 4.0 K/uL   Monocytes Relative 8 3 - 12 %   Monocytes Absolute 0.5 0.1 - 1.0 K/uL   Eosinophils Relative 4 0 - 5 %   Eosinophils Absolute 0.3 0.0 - 0.7 K/uL   Basophils Relative 1 0 - 1 %   Basophils Absolute 0.1 0.0 - 0.1 K/uL  Protime-INR  Result Value Ref Range   Prothrombin Time 13.4 11.6 - 15.2 seconds   INR 1.00 0.00 - 1.49    Chest 2 View  03/10/2015   CLINICAL DATA:  Preoperative exam for laminectomy surgery scheduled for July 13, history of bronchitis, nonsmoker, no current symptoms.  EXAM: CHEST  2 VIEW  COMPARISON:  PA and lateral chest x-ray dated October 16, 2014  FINDINGS: The lungs are adequately inflated. There is no focal infiltrate. There is no pleural effusion. The heart and pulmonary vascularity are normal. The mediastinum is normal in width. The trachea is midline. The bony thorax exhibits no acute abnormality.  IMPRESSION: There is no active cardiopulmonary disease.   Electronically Signed   By: Breahna Boylen  [place] M.D.   On: 03/10/2015 09:55  Antibiotics:  Anti-infectives    Start     Dose/Rate Route Frequency Ordered Stop   03/17/15 1630  ceFAZolin (ANCEF) IVPB 1 g/50 mL premix     1 g 100 mL/hr over 30 Minutes Intravenous Every 8 hours 03/17/15 1328 03/18/15 0100   03/17/15 0951  bacitracin 50,000 Units in sodium chloride irrigation 0.9 % 500 mL irrigation  Status:  Discontinued       As needed 03/17/15 0951 03/17/15 1120   03/17/15 0845  ceFAZolin (ANCEF) IVPB 2 g/50 mL premix     2 g 100 mL/hr over 30 Minutes Intravenous To [facility] 03/16/15 1232 03/17/15 0927      Discharge Exam: Blood pressure 105/57, pulse 67, temperature 98.4 F (36.9 C), temperature source Oral, resp. rate 18, weight 237 lb (107.502 kg), SpO2 100 %. Neurologic: Grossly normal Dressing dry  Discharge Medications:     Medication List    STOP taking these  medications        oxyCODONE 5 MG immediate release tablet  Commonly known as:  Oxy IR/ROXICODONE      TAKE these medications        docusate sodium 100 MG capsule  Commonly known as:  COLACE  Take 300 mg by mouth daily.     gabapentin 300 MG capsule  Commonly known as:  NEURONTIN  Take 600 mg by mouth 3 (three) times daily.     morphine 30 MG 12 hr tablet  Commonly known as:  MS CONTIN  Take 30 mg by mouth every 12 (twelve) hours.     tiZANidine 4 MG tablet  Commonly known as:  ZANAFLEX  Take 1 tablet (4 mg total) by mouth 3 (three) times daily.        Disposition: home   Final Dx: lumbar decompression/ fusion      Discharge Instructions     Remove dressing in 72 hours    Complete by:  As directed      Call MD for:  difficulty breathing, headache or visual disturbances    Complete by:  As directed      Call MD for:  persistant nausea and vomiting    Complete by:  As directed      Call MD for:  redness, tenderness, or signs of infection (pain, swelling, redness, odor or green/yellow discharge around incision site)    Complete by:  As directed      Call MD for:  severe uncontrolled pain    Complete by:  As directed      Call MD for:  temperature >100.4    Complete by:  As directed      Diet - low sodium heart healthy    Complete by:  As directed      Discharge instructions    Complete by:  As directed   No drinving, no bending or twisting, no heavy lifting, may shower     Increase activity slowly    Complete by:  As directed            Follow-up Information    Follow up with Jlee Harkless S, MD In 2 weeks.   Specialty:  Neurosurgery   Contact information:   [address] [place] [address] [phone]        Signed: Tia Alert 03/18/2015, 12:00 PM

## 2015-03-18 NOTE — Progress Notes (Signed)
Occupational Therapy Evaluation Patient Details Name: [name] MRN: [id number] DOB: [birth year] Today's Date: 03/18/2015    History of Present Illness pt is a 39 y/o male admitted with back pain, stenosis at L34 and painful hardware from previous surgery.   Clinical Impression   Patient admitted with above. Patient independent PTA. Patient currently functioning at an overall mod I level. D/C from acute OT services and no additional follow-up OT needs at this time. All appropriate education provided to patient. Please re-order OT if needed.      Follow Up Recommendations  No OT follow up;Supervision - Intermittent    Equipment Recommendations  None recommended by OT    Recommendations for Other Services  None at this time  Precautions / Restrictions Precautions Precautions: Back Restrictions Weight Bearing Restrictions: No    Mobility Bed Mobility Overal bed mobility: Modified Independent Bed Mobility: Rolling;Sidelying to Sit Rolling: Supervision Sidelying to sit: Supervision     Sit to sidelying: Supervision General bed mobility comments: Pt demonstrated safe log roll technique  Transfers Overall transfer level: Modified independent Equipment used: Rolling walker (2 wheeled) Transfers: Sit to/from Stand Sit to Stand: Supervision General transfer comment: Pt with increased pain, therefore required increased time to perform transfers    Balance Overall balance assessment: No apparent balance deficits (not formally assessed)    ADL Overall ADL's : Modified independent General ADL Comments: Pt overall mod I with ADLs and functional mobility. Pt states he has a reacher and LH sponge at home and pt demostrated using reacher to doff and donn bilateral socks. Educated patient on use of cups for grooming tasks at sink to prevent from breaking back precautions. Pt independently adhered to back precautions during bed mobility, ADL, transfers, and functional  ambulation/mobility.     Pertinent Vitals/Pain Pain Assessment: 0-10 Pain Score: 10-Worst pain ever Pain Location: back - in standing Pain Descriptors / Indicators: Aching;Constant Pain Intervention(s): Monitored during session;Repositioned     Hand Dominance Right   Extremity/Trunk Assessment Upper Extremity Assessment Upper Extremity Assessment: Overall WFL for tasks assessed   Lower Extremity Assessment Lower Extremity Assessment: Defer to PT evaluation       Communication Communication Communication: No difficulties   Cognition Arousal/Alertness: Awake/alert Behavior During Therapy: WFL for tasks assessed/performed Overall Cognitive Status: Within Functional Limits for tasks assessed              Home Living Family/patient expects to be discharged to:: Private residence Living Arrangements: Spouse/significant other Available Help at Discharge: Family;Available 24 hours/day Type of Home: House Home Access: Level entry     Home Layout: Two level Alternate Level Stairs-Number of Steps: 12 Alternate Level Stairs-Rails: Right;Left Bathroom Shower/Tub: Tub/shower unit;Door   Bathroom Toilet: Handicapped height     Home Equipment: Environmental consultant - 2 wheels;Toilet riser   Additional Comments: Pt plans to discharge to his parent's house in [place]    Prior Functioning/Environment Level of Independence: Independent     OT Diagnosis: Generalized weakness   OT Problem List:  n/a, no acute OT needs identified    OT Treatment/Interventions:   n/a, no acute OT needs identified    OT Goals(Current goals can be found in the care plan section) Acute Rehab OT Goals Patient Stated Goal: go home today OT Goal Formulation: All assessment and education complete, DC therapy  OT Frequency:   n/a, no acute OT needs identified    Barriers to D/C:  None known at this time    End of Session Equipment Utilized During  Treatment: Rolling walker;Back brace Nurse Communication:  Patient requests pain meds  Activity Tolerance: Patient tolerated treatment well Patient left: with call bell/phone within reach;in chair   Time: 1344-1406 OT Time Calculation (min): 22 min Charges:  OT General Charges $OT Visit: 1 Procedure OT Evaluation $Initial OT Evaluation Tier I: 1 Procedure  Jemaine Prokop , MS, OTR/L, CLT Pager: [phone]  03/18/2015, 2:30 PM

## 2015-03-18 NOTE — Progress Notes (Signed)
Patient alert and oriented, mae's well, voiding adequate amount of urine, swallowing without difficulty, no c/o pain. Patient discharged home with family. Script and discharged instructions given to patient. Patient and family stated understanding of d/c instructions given and has an appointment with MD. 

## 2015-03-18 NOTE — Evaluation (Signed)
Physical Therapy Evaluation Patient Details Name: Todd KidneyRobert Okubo MRN: [id number] DOB: 1976-03-17 Today's Date: 03/18/2015   History of Present Illness  pt is a 39 y/o male admitted with back pain, stenosis at L34 and painful hardware from previous surgery.  Clinical Impression  Pt is at or close to baseline functioning and should be safe at home with family assist.  All back education completed and pt demo'd understanding.  There are no further acute PT needs.  Will sign off at this time.     Follow Up Recommendations No PT follow up;Supervision for mobility/OOB    Equipment Recommendations  None recommended by PT    Recommendations for Other Services       Precautions / Restrictions Precautions Precautions: Back Restrictions Weight Bearing Restrictions: No      Mobility  Bed Mobility Overal bed mobility: Needs Assistance Bed Mobility: Rolling;Sidelying to Sit;Sit to Sidelying Rolling: Supervision Sidelying to sit: Supervision     Sit to sidelying: Supervision General bed mobility comments: instucted and practiced safe technique  Transfers Overall transfer level: Needs assistance   Transfers: Sit to/from Stand Sit to Stand: Supervision            Ambulation/Gait Ambulation/Gait assistance: Supervision Ambulation Distance (Feet): 150 Feet Assistive device: Rolling walker (2 wheeled) Gait Pattern/deviations: Step-through pattern Gait velocity: slow and guarded      Stairs Stairs: Yes Stairs assistance: Supervision Stair Management: One rail Right;Alternating pattern;Forwards Number of Stairs: 4 General stair comments: steady with rail  Wheelchair Mobility    Modified Rankin (Stroke Patients Only)       Balance Overall balance assessment: No apparent balance deficits (not formally assessed)                                           Pertinent Vitals/Pain Pain Assessment: 0-10 Pain Score: 9  Pain Location: back Pain  Descriptors / Indicators: Aching;Constant Pain Intervention(s): Monitored during session;Premedicated before session    Home Living Family/patient expects to be discharged to:: Private residence Living Arrangements: Spouse/significant other Available Help at Discharge: Family;Available 24 hours/day Type of Home: House Home Access: Level entry     Home Layout: Two level Home Equipment: Walker - 2 wheels (riser with handles)      Prior Function Level of Independence: Independent               Hand Dominance        Extremity/Trunk Assessment   Upper Extremity Assessment: Defer to OT evaluation           Lower Extremity Assessment: Overall WFL for tasks assessed (some left side radicular pain and numbness)         Communication   Communication: No difficulties  Cognition Arousal/Alertness: Awake/alert Behavior During Therapy: WFL for tasks assessed/performed Overall Cognitive Status: Within Functional Limits for tasks assessed                      General Comments General comments (skin integrity, edema, etc.): Complete all back care/precs incl lifting prec bed mobility, bracing issues and progression of activity.    Exercises        Assessment/Plan    PT Assessment Patent does not need any further PT services  PT Diagnosis     PT Problem List    PT Treatment Interventions     PT Goals (Current goals can be found  in the Care Plan section) Acute Rehab PT Goals PT Goal Formulation: All assessment and education complete, DC therapy    Frequency     Barriers to discharge        Co-evaluation               End of Session Equipment Utilized During Treatment: Back brace Activity Tolerance: Patient tolerated treatment well Patient left: in bed;with call bell/phone within reach           Time: 1050-1114 PT Time Calculation (min) (ACUTE ONLY): 24 min   Charges:   PT Evaluation $Initial PT Evaluation Tier I: 1 Procedure PT  Treatments $Gait Training: 8-22 mins   PT G Codes:        Kendal Raffo, Eliseo Gum 03/18/2015, 11:32 AM 03/18/2015  Force Bing, PT [phone]  (pager)

## 2015-03-22 NOTE — Progress Notes (Signed)
Late Entry Addendum to the Initial Evaluation    03/18/15 1117  PT Time Calculation  PT Start Time (ACUTE ONLY) 1050  PT Stop Time (ACUTE ONLY) 1114  PT Time Calculation (min) (ACUTE ONLY) 24 min  PT G-Codes **NOT FOR INPATIENT CLASS**  Functional Assessment Tool Used clinical judgement  Functional Limitation Mobility: Walking and moving around  Mobility: Walking and Moving Around Current Status (Z6109(G8978) CI  Mobility: Walking and Moving Around Goal Status (U0454(G8979) CI  Mobility: Walking and Moving Around Discharge Status (U9811(G8980) CI  PT General Charges  $$ ACUTE PT VISIT 1 Procedure  PT Evaluation  $Initial PT Evaluation Tier I 1 Procedure  PT Treatments  $Gait Training 8-22 mins   03/22/2015  Byram BingKen Arrion Burruel, PT [phone]  (pager)

## 2016-04-26 ENCOUNTER — Other Ambulatory Visit: Payer: Self-pay | Admitting: Anesthesiology

## 2016-05-17 NOTE — Pre-Procedure Instructions (Signed)
Milana KidneyRobert Schermerhorn  05/17/2016      [facility] [address] - [place], [place] - [address] [place] [address] Phone: [phone] Fax: [phone]  [facility] [address] - [place], [place] - [id number] [place] [id number] Phone: [phone] Fax: [phone]  [facility] [address] - [place], [place] - [address] [place] [address] Phone: [phone] Fax: [phone]    Your procedure is scheduled on Friday September 22  [facility] at 0930 A.M.  Call this number if you have problems the morning of surgery:  [phone]   Remember:  Do not eat food or drink liquids after midnight.   Take these medicines the morning of surgery with A SIP OF WATER gabapentin (neurontin), morphine if needed, tiZANidine (ZANAFLEX)  7 days prior to surgery STOP taking any Aspirin, Aleve, Naproxen, Ibuprofen, Motrin, Advil, Goody's, BC's, all herbal medications, fish oil, and all vitamins   Do not wear jewelry.  Do not wear lotions, powders, or cologne, or deoderant.  Men may shave face and neck.  Do not bring valuables to the hospital.  [facility] is not responsible for any belongings or valuables.  Contacts, dentures or bridgework may not be worn into surgery.  Leave your suitcase in the car.  After surgery it may be brought to your room.  For patients admitted to the hospital, discharge time will be determined by your treatment team.  Patients discharged the day of surgery will not be allowed to drive home.    Special instructions:   [place]- Preparing For Surgery  Before surgery, you can play an important role. Because skin is not sterile, your skin needs to be as free of germs as possible. You can reduce the number of germs on your skin by washing with CHG (chlorahexidine gluconate) Soap before surgery.  CHG is an antiseptic cleaner which kills germs and  bonds with the skin to continue killing germs even after washing.  Please do not use if you have an allergy to CHG or antibacterial soaps. If your skin becomes reddened/irritated stop using the CHG.  Do not shave (including legs and underarms) for at least 48 hours prior to first CHG shower. It is OK to shave your face.  Please follow these instructions carefully.   1. Shower the NIGHT BEFORE SURGERY and the MORNING OF SURGERY with CHG.   2. If you chose to wash your hair, wash your hair first as usual with your normal shampoo.  3. After you shampoo, rinse your hair and body thoroughly to remove the shampoo.  4. Use CHG as you would any other liquid soap. You can apply CHG directly to the skin and wash gently with a scrungie or a clean washcloth.   5. Apply the CHG Soap to your body ONLY FROM THE NECK DOWN.  Do not use on open wounds or open sores. Avoid contact with your eyes, ears, mouth and genitals (private parts). Wash genitals (private parts) with your normal soap.  6. Wash thoroughly, paying special attention to the area where your surgery will be performed.  7. Thoroughly rinse your body with warm water from the neck down.  8. DO NOT shower/wash with your normal soap after using and rinsing off the CHG Soap.  9. Pat yourself dry with a CLEAN TOWEL.   10. Wear CLEAN PAJAMAS   11. Place CLEAN SHEETS on your bed the  night of your first shower and DO NOT SLEEP WITH PETS.    Day of Surgery: Do not apply any deodorants/lotions. Please wear clean clothes to the hospital/surgery center.      Please read over the following fact sheets that you were given. Coughing and Deep Breathing, MRSA Information and Surgical Site Infection Prevention

## 2016-05-18 ENCOUNTER — Encounter (HOSPITAL_COMMUNITY): Payer: Self-pay

## 2016-05-18 ENCOUNTER — Encounter (HOSPITAL_COMMUNITY)
Admission: RE | Admit: 2016-05-18 | Discharge: 2016-05-18 | Disposition: A | Discharge status: Home / Self Care | Payer: BLUE CROSS/BLUE SHIELD | Source: Ambulatory Visit | Attending: Anesthesiology | Admitting: Anesthesiology

## 2016-05-18 DIAGNOSIS — Z01818 Encounter for other preprocedural examination: Secondary | ICD-10-CM | POA: Diagnosis not present

## 2016-05-18 DIAGNOSIS — Y838 Other surgical procedures as the cause of abnormal reaction of the patient, or of later complication, without mention of misadventure at the time of the procedure: Secondary | ICD-10-CM | POA: Diagnosis not present

## 2016-05-18 DIAGNOSIS — Z01812 Encounter for preprocedural laboratory examination: Secondary | ICD-10-CM | POA: Insufficient documentation

## 2016-05-18 DIAGNOSIS — M961 Postlaminectomy syndrome, not elsewhere classified: Secondary | ICD-10-CM | POA: Diagnosis not present

## 2016-05-18 LAB — CBC
HCT: 46 % (ref 39.0–52.0)
Hemoglobin: 15.5 g/dL (ref 13.0–17.0)
MCH: 30.2 pg (ref 26.0–34.0)
MCHC: 33.7 g/dL (ref 30.0–36.0)
MCV: 89.7 fL (ref 78.0–100.0)
PLATELETS: 226 10*3/uL (ref 150–400)
RBC: 5.13 MIL/uL (ref 4.22–5.81)
RDW: 12.3 % (ref 11.5–15.5)
WBC: 7.9 10*3/uL (ref 4.0–10.5)

## 2016-05-18 LAB — SURGICAL PCR SCREEN
MRSA, PCR: NEGATIVE
STAPHYLOCOCCUS AUREUS: NEGATIVE

## 2016-05-18 LAB — BASIC METABOLIC PANEL
Anion gap: 8 (ref 5–15)
BUN: 12 mg/dL (ref 6–20)
CALCIUM: 9.6 mg/dL (ref 8.9–10.3)
CO2: 24 mmol/L (ref 22–32)
CREATININE: 0.88 mg/dL (ref 0.61–1.24)
Chloride: 107 mmol/L (ref 101–111)
Glucose, Bld: 110 mg/dL — ABNORMAL HIGH (ref 65–99)
Potassium: 4.1 mmol/L (ref 3.5–5.1)
SODIUM: 139 mmol/L (ref 135–145)

## 2016-05-18 NOTE — Progress Notes (Signed)
PCP -  Albertina Parrhomas Jolly Cardiologist - denies  Chest x-ray - not needed EKG -03/10/15 Normal SR - no heart history  Stress Test - denies ECHO - denies Cardiac Cath - denies    Patient denies shortness of breath, fever, cough and chest pain at PAT appointment

## 2016-05-23 NOTE — H&P (Signed)
[name] is an 40 y.o. male.   Chief Complaint: back pain, radiation into the legs HPI: Patient with lumbar fusion, good surgical outcome but has continued to have persistent pain symtoms. He has previously failed multiple intervention such as injection therapy, TENS units, physical therapy, neuromodulator's, muscle relaxants as well as narcotics.  Underwent SCS trial in August and returned stating that he considered his trial excellent.  He stated that he is felt the best he has in many years.  He was able to back off his pain medication which has ultimately made him feel better as well.  He feels less sedated taking less medication.  He reported good coverage from the stimulator.  He had no issues with it.  He reported at least a 50% improvement in his symptoms if not more.  He went from rating his pain from a 5 out of 10 to a 2 out of 10.  He tolerated the trial well and is looking forward to the surgery.   Past Medical History:  Diagnosis Date  . Chronic back pain    DDD and stenosis  . Constipation    r/t pain meds and takes Colace daily  . Headache   . History of bronchitis 1995  . Muscle spasm    takes Tizanidine daily as needed  . Peripheral neuropathy (HCC)    takes Gabapentin daily  . Psoriasis     Past Surgical History:  Procedure Laterality Date  . BACK SURGERY  06,11  . LUMBAR LAMINECTOMY  07/03/2013   L 5  L4 S1        Dr Yetta BarreJones  . LUMBAR LAMINECTOMY/DECOMPRESSION MICRODISCECTOMY N/A 03/17/2015   Procedure: Laminectomy and Foraminotomy - L3-L4, hardware extraction;  Surgeon: Tia Alertavid S Jones, MD;  Location: [facility];  Service: Neurosurgery;  Laterality: N/A;  Laminectomy and Foraminotomy - L3-L4, hardware extraction  . MAXIMUM ACCESS (MAS)POSTERIOR LUMBAR INTERBODY FUSION (PLIF) 2 LEVEL N/A 07/03/2013   Procedure: FOR MAXIMUM ACCESS SURGERY POSTERIOR LUMBAR INTERBODY FUSION  LUMBAR FOUR-FIVE ,LUMBAR FIVE-SACRAL ONE;  Surgeon: Tia Alertavid S Jones, MD;  Location: [facility];   Service: Neurosurgery;  Laterality: N/A;    No family history on file. Social History:  reports that he has never smoked. He has never used smokeless tobacco. He reports that he does not drink alcohol or use drugs.  Allergies: No Known Allergies  Medications Prior to Admission  Medication Sig Dispense Refill  . DULoxetine (CYMBALTA) 30 MG capsule Take 30 mg by mouth daily.    Marland Kitchen. gabapentin (NEURONTIN) 600 MG tablet Take 600 mg by mouth 3 (three) times daily.    Marland Kitchen. oxyCODONE (OXY IR/ROXICODONE) 5 MG immediate release tablet Take 5 mg by mouth every 4 (four) hours as needed for severe pain.    Marland Kitchen. tiZANidine (ZANAFLEX) 4 MG tablet Take 1 tablet (4 mg total) by mouth 3 (three) times daily. 60 tablet 1  . docusate sodium (COLACE) 100 MG capsule Take 300 mg by mouth daily.    Marland Kitchen. gabapentin (NEURONTIN) 300 MG capsule Take 600 mg by mouth 3 (three) times daily.    Marland Kitchen. morphine (MS CONTIN) 30 MG 12 hr tablet Take 30 mg by mouth every 12 (twelve) hours.      No results found for this or any previous visit (from the past 48 hour(s)). No results found.  Review of Systems  Constitutional: Negative.   HENT: Negative.   Eyes: Negative.   Respiratory: Negative.   Cardiovascular: Negative.   Gastrointestinal: Negative.  Genitourinary: Negative.   Musculoskeletal: Positive for back pain. Negative for falls and joint pain.  Skin: Negative.   Neurological: Negative.   Endo/Heme/Allergies: Negative.   Psychiatric/Behavioral: Negative.     Blood pressure (!) 137/99, pulse 84, temperature 98.1 F (36.7 C), temperature source Oral, resp. rate 20, weight 113.4 kg (250 lb), SpO2 97 %. Physical Exam  Constitutional: He is oriented to person, place, and time. He appears well-developed and well-nourished.  Eyes: Conjunctivae are normal. Pupils are equal, round, and reactive to light.  Neck: Normal range of motion.  Cardiovascular: Normal rate and regular rhythm.   Respiratory: Effort normal.   Musculoskeletal: Normal range of motion.  Neurological: He is alert and oriented to person, place, and time.  Skin: Skin is warm and dry.  Psychiatric: He has a normal mood and affect. His behavior is normal. Thought content normal.     Assessment/Plan A) lumbar post-laminectomy syndrome; chronic pain syndrome P) permanent SCS,   Gwynne Edinger, MD 05/26/2016, 9:56 AM

## 2016-05-26 ENCOUNTER — Ambulatory Visit (HOSPITAL_COMMUNITY): Payer: BLUE CROSS/BLUE SHIELD | Admitting: Certified Registered Nurse Anesthetist

## 2016-05-26 ENCOUNTER — Ambulatory Visit (HOSPITAL_COMMUNITY): Payer: BLUE CROSS/BLUE SHIELD

## 2016-05-26 ENCOUNTER — Encounter (HOSPITAL_COMMUNITY)
Admission: RE | Disposition: A | Discharge status: Home / Self Care | Payer: Self-pay | Source: Ambulatory Visit | Attending: Anesthesiology

## 2016-05-26 ENCOUNTER — Ambulatory Visit (HOSPITAL_COMMUNITY)
Admission: RE | Admit: 2016-05-26 | Discharge: 2016-05-26 | Disposition: A | Discharge status: Home / Self Care | Payer: BLUE CROSS/BLUE SHIELD | Source: Ambulatory Visit | Attending: Anesthesiology | Admitting: Anesthesiology

## 2016-05-26 ENCOUNTER — Encounter (HOSPITAL_COMMUNITY): Payer: Self-pay | Admitting: *Deleted

## 2016-05-26 DIAGNOSIS — M961 Postlaminectomy syndrome, not elsewhere classified: Secondary | ICD-10-CM | POA: Insufficient documentation

## 2016-05-26 DIAGNOSIS — M5416 Radiculopathy, lumbar region: Secondary | ICD-10-CM | POA: Diagnosis not present

## 2016-05-26 DIAGNOSIS — G629 Polyneuropathy, unspecified: Secondary | ICD-10-CM | POA: Insufficient documentation

## 2016-05-26 DIAGNOSIS — G894 Chronic pain syndrome: Secondary | ICD-10-CM | POA: Diagnosis not present

## 2016-05-26 DIAGNOSIS — Z419 Encounter for procedure for purposes other than remedying health state, unspecified: Secondary | ICD-10-CM

## 2016-05-26 DIAGNOSIS — G8929 Other chronic pain: Secondary | ICD-10-CM | POA: Diagnosis present

## 2016-05-26 HISTORY — PX: SPINAL CORD STIMULATOR INSERTION: SHX5378

## 2016-05-26 SURGERY — INSERTION, SPINAL CORD STIMULATOR, LUMBAR
Anesthesia: Monitor Anesthesia Care

## 2016-05-26 MED ORDER — CEFAZOLIN SODIUM-DEXTROSE 2-4 GM/100ML-% IV SOLN
2.0000 g | INTRAVENOUS | Status: AC
  Administered 2016-05-26: 2 g via INTRAVENOUS
  Filled 2016-05-26: qty 100

## 2016-05-26 MED ORDER — OXYCODONE HCL 5 MG PO TABS: 10.0000 mg | ORAL_TABLET | ORAL | 0 refills | Status: AC | PRN

## 2016-05-26 MED ORDER — BUPIVACAINE-EPINEPHRINE (PF) 0.5% -1:200000 IJ SOLN
INTRAMUSCULAR | Status: DC | PRN
  Administered 2016-05-26: 30 mL via PERINEURAL

## 2016-05-26 MED ORDER — FENTANYL CITRATE (PF) 100 MCG/2ML IJ SOLN
INTRAMUSCULAR | Status: AC
  Filled 2016-05-26: qty 2

## 2016-05-26 MED ORDER — OXYCODONE HCL 5 MG PO TABS
ORAL_TABLET | ORAL | Status: AC
Start: 2016-05-26 — End: 2016-05-26
  Administered 2016-05-26: 5 mg
  Filled 2016-05-26: qty 1

## 2016-05-26 MED ORDER — CEPHALEXIN 500 MG PO CAPS: 500.0000 mg | ORAL_CAPSULE | Freq: Three times a day (TID) | ORAL | 0 refills | Status: AC

## 2016-05-26 MED ORDER — CHLORHEXIDINE GLUCONATE CLOTH 2 % EX PADS: 6.0000 | MEDICATED_PAD | Freq: Once | CUTANEOUS | Status: DC

## 2016-05-26 MED ORDER — HYDROMORPHONE HCL 1 MG/ML IJ SOLN
INTRAMUSCULAR | Status: AC
  Filled 2016-05-26: qty 1

## 2016-05-26 MED ORDER — LIDOCAINE HCL (CARDIAC) 20 MG/ML IV SOLN
INTRAVENOUS | Status: DC | PRN
  Administered 2016-05-26: 100 mg via INTRAVENOUS

## 2016-05-26 MED ORDER — BACITRACIN-NEOMYCIN-POLYMYXIN OINTMENT TUBE
TOPICAL_OINTMENT | CUTANEOUS | Status: DC | PRN
  Administered 2016-05-26: 1 via TOPICAL

## 2016-05-26 MED ORDER — BACITRACIN 50000 UNITS IM SOLR
INTRAMUSCULAR | Status: DC | PRN
  Administered 2016-05-26: 500 mL

## 2016-05-26 MED ORDER — FENTANYL CITRATE (PF) 100 MCG/2ML IJ SOLN
INTRAMUSCULAR | Status: DC | PRN
  Administered 2016-05-26 (×6): 50 ug via INTRAVENOUS

## 2016-05-26 MED ORDER — LACTATED RINGERS IV SOLN
INTRAVENOUS | Status: DC | PRN
  Administered 2016-05-26: 10:00:00 via INTRAVENOUS

## 2016-05-26 MED ORDER — FENTANYL CITRATE (PF) 100 MCG/2ML IJ SOLN
INTRAMUSCULAR | Status: AC
  Filled 2016-05-26: qty 4

## 2016-05-26 MED ORDER — MIDAZOLAM HCL 2 MG/2ML IJ SOLN
INTRAMUSCULAR | Status: DC | PRN
  Administered 2016-05-26: 2 mg via INTRAVENOUS

## 2016-05-26 MED ORDER — HYDROMORPHONE HCL 1 MG/ML IJ SOLN
0.2500 mg | INTRAMUSCULAR | Status: DC | PRN
  Administered 2016-05-26 (×2): 0.5 mg via INTRAVENOUS

## 2016-05-26 MED ORDER — PROPOFOL 500 MG/50ML IV EMUL
INTRAVENOUS | Status: DC | PRN
  Administered 2016-05-26: 75 ug/kg/min via INTRAVENOUS

## 2016-05-26 MED ORDER — MIDAZOLAM HCL 2 MG/2ML IJ SOLN
INTRAMUSCULAR | Status: AC
  Filled 2016-05-26: qty 2

## 2016-05-26 MED ORDER — PROMETHAZINE HCL 25 MG/ML IJ SOLN: 6.2500 mg | INTRAMUSCULAR | Status: DC | PRN

## 2016-05-26 MED ORDER — 0.9 % SODIUM CHLORIDE (POUR BTL) OPTIME
TOPICAL | Status: DC | PRN
  Administered 2016-05-26: 1000 mL

## 2016-05-26 MED ORDER — PROPOFOL 10 MG/ML IV BOLUS
INTRAVENOUS | Status: DC | PRN
  Administered 2016-05-26: 20 mg via INTRAVENOUS

## 2016-05-26 MED ORDER — LACTATED RINGERS IV SOLN
INTRAVENOUS | Status: DC
  Administered 2016-05-26: 08:00:00 via INTRAVENOUS

## 2016-05-26 SURGICAL SUPPLY — 63 items
ANCHOR CLIK X NEURO (Stimulator) ×2 IMPLANT
BAG DECANTER FOR FLEXI CONT (MISCELLANEOUS) ×2 IMPLANT
BENZOIN TINCTURE PRP APPL 2/3 (GAUZE/BANDAGES/DRESSINGS) IMPLANT
BINDER ABDOMINAL 12 ML 46-62 (SOFTGOODS) ×2 IMPLANT
BLADE CLIPPER SURG (BLADE) IMPLANT
CABLE OR STIMULATOR 2X8 61 (WIRE) ×4 IMPLANT
CHLORAPREP W/TINT 26ML (MISCELLANEOUS) ×2 IMPLANT
CLIP TI WIDE RED SMALL 6 (CLIP) IMPLANT
DERMABOND ADVANCED (GAUZE/BANDAGES/DRESSINGS) ×1
DERMABOND ADVANCED .7 DNX12 (GAUZE/BANDAGES/DRESSINGS) ×1 IMPLANT
DRAPE C-ARM 42X72 X-RAY (DRAPES) ×2 IMPLANT
DRAPE C-ARMOR (DRAPES) ×2 IMPLANT
DRAPE LAPAROTOMY 100X72X124 (DRAPES) ×2 IMPLANT
DRAPE POUCH INSTRU U-SHP 10X18 (DRAPES) ×2 IMPLANT
DRAPE SURG 17X23 STRL (DRAPES) ×2 IMPLANT
DRSG OPSITE POSTOP 3X4 (GAUZE/BANDAGES/DRESSINGS) ×2 IMPLANT
DRSG OPSITE POSTOP 4X6 (GAUZE/BANDAGES/DRESSINGS) ×2 IMPLANT
ELECT REM PT RETURN 9FT ADLT (ELECTROSURGICAL) ×2
ELECTRODE REM PT RTRN 9FT ADLT (ELECTROSURGICAL) ×1 IMPLANT
GAUZE SPONGE 4X4 16PLY XRAY LF (GAUZE/BANDAGES/DRESSINGS) ×2 IMPLANT
GLOVE BIOGEL PI IND STRL 7.5 (GLOVE) ×2 IMPLANT
GLOVE BIOGEL PI INDICATOR 7.5 (GLOVE) ×2
GLOVE ECLIPSE 7.5 STRL STRAW (GLOVE) ×2 IMPLANT
GLOVE EXAM NITRILE LRG STRL (GLOVE) IMPLANT
GLOVE EXAM NITRILE XL STR (GLOVE) IMPLANT
GLOVE EXAM NITRILE XS STR PU (GLOVE) IMPLANT
GLOVE SURG SS PI 6.5 STRL IVOR (GLOVE) ×4 IMPLANT
GOWN STRL REUS W/ TWL LRG LVL3 (GOWN DISPOSABLE) IMPLANT
GOWN STRL REUS W/ TWL XL LVL3 (GOWN DISPOSABLE) IMPLANT
GOWN STRL REUS W/TWL 2XL LVL3 (GOWN DISPOSABLE) IMPLANT
GOWN STRL REUS W/TWL LRG LVL3 (GOWN DISPOSABLE)
GOWN STRL REUS W/TWL XL LVL3 (GOWN DISPOSABLE)
IPG PRECISION SPECTRA (Stimulator) ×2 IMPLANT
KIT BASIN OR (CUSTOM PROCEDURE TRAY) ×2 IMPLANT
KIT CHARGING (KITS) ×1
KIT CHARGING PRECISION NEURO (KITS) ×1 IMPLANT
KIT PAT PROGRAM FREELINK (KITS) ×1 IMPLANT
KIT ROOM TURNOVER OR (KITS) ×2 IMPLANT
LEAD INFINION CX PERC 70CM (Cable) ×4 IMPLANT
NEEDLE 18GX1X1/2 (RX/OR ONLY) (NEEDLE) IMPLANT
NEEDLE ENTRADA 4.5IN (NEEDLE) ×4 IMPLANT
NEEDLE HYPO 25X1 1.5 SAFETY (NEEDLE) ×2 IMPLANT
NS IRRIG 1000ML POUR BTL (IV SOLUTION) ×2 IMPLANT
PACK LAMINECTOMY NEURO (CUSTOM PROCEDURE TRAY) ×2 IMPLANT
PAD ARMBOARD 7.5X6 YLW CONV (MISCELLANEOUS) ×2 IMPLANT
REMOTE CONTROL KIT (KITS) ×2
SPONGE LAP 4X18 X RAY DECT (DISPOSABLE) ×2 IMPLANT
SPONGE SURGIFOAM ABS GEL SZ50 (HEMOSTASIS) IMPLANT
STAPLER SKIN PROX WIDE 3.9 (STAPLE) ×2 IMPLANT
STRIP CLOSURE SKIN 1/2X4 (GAUZE/BANDAGES/DRESSINGS) IMPLANT
SUT MNCRL AB 4-0 PS2 18 (SUTURE) IMPLANT
SUT SILK 0 (SUTURE) ×2
SUT SILK 0 MO-6 18XCR BRD 8 (SUTURE) ×2 IMPLANT
SUT SILK 0 TIES 10X30 (SUTURE) IMPLANT
SUT SILK 2 0 TIES 10X30 (SUTURE) IMPLANT
SUT VIC AB 2-0 CP2 18 (SUTURE) ×6 IMPLANT
SYR EPIDURAL 5ML GLASS (SYRINGE) ×2 IMPLANT
SYRINGE 10CC LL (SYRINGE) IMPLANT
TOOL LONG TUNNEL (SPINAL CORD STIMULATOR) ×2 IMPLANT
TOWEL OR 17X24 6PK STRL BLUE (TOWEL DISPOSABLE) ×2 IMPLANT
TOWEL OR 17X26 10 PK STRL BLUE (TOWEL DISPOSABLE) ×2 IMPLANT
WATER STERILE IRR 1000ML POUR (IV SOLUTION) ×2 IMPLANT
YANKAUER SUCT BULB TIP NO VENT (SUCTIONS) ×2 IMPLANT

## 2016-05-26 NOTE — Anesthesia Postprocedure Evaluation (Signed)
Anesthesia Post Note  Patient: Todd KidneyRobert Darrow  Procedure(s) Performed: Procedure(s) (LRB): LUMBAR SPINAL CORD STIMULATOR INSERTION (N/A)  Patient location during evaluation: PACU Anesthesia Type: MAC Level of consciousness: awake and alert Pain management: pain level controlled Vital Signs Assessment: post-procedure vital signs reviewed and stable Respiratory status: spontaneous breathing, nonlabored ventilation, respiratory function stable and patient connected to nasal cannula oxygen Cardiovascular status: stable and blood pressure returned to baseline Anesthetic complications: no    Last Vitals:  Vitals:   05/26/16 1138 05/26/16 1145  BP: 124/89   Pulse: 94 91  Resp: 19 15  Temp: 36.3 C     Last Pain:  Vitals:   05/26/16 0754  TempSrc:   PainSc: 6                  Markell Schrier,JAMES TERRILL

## 2016-05-26 NOTE — Op Note (Signed)
PREOP DX: 1) lumbago  2) lumbar radiculopathy  3) lumbar post-laminectomy syndrome  4) chronic pain  POSTOP DX: 1) lumbago  2) lumbar radiculopathy  3) lumbar post-laminectomy syndrome  4) chronic pain PROCEDURES PERFORMED:1) intraop fluoro 2) placement of 2 16 contact [employer] Infinion leads 3) placement of Spectra SCS generator  SURGEON:Shivank Pinedo  ASSISTANT: NONE  ANESTHESIA: MAC  EBL: <20cc  DESCRIPTION OF PROCEDURE: After a discussion of risks, benefits and alternatives, informed consent was obtained. The patient was taken to the OR, turned prone onto a Jackson table, all pressure points padded, SCD's placed, and an adequate plane of anesthesia induced. A timeout was taken to verify the correct patient, position, personnel, availability of appropriate equipment, and administration of perioperative antibiotics.   The thoracic and lumbar areas were widely prepped with chloraprep and draped into a sterile field. Fluoroscopy was used to plan a left paramedian incision at the T12-L3 levels, and an incision made with a 10 blade and carried down to the dorsolumbar fascia with the bovie and blunt dissection. Retractors were placed and a 14g [employer] tuohy needle placed into the epidural space at the T11-12 interspace using biplanar fluoro and loss-of-resistance technique. The needle was aspirated without any return of fluid. A [employer] INFINION lead was introduced and under live AP fluoro advanced until the distal-most contact overlay the midportion of the T7 vertebral body shadow with the rest of the contacts distributed over the T8 and T9 vertebral bodies in a position just right of anatomic midline. A second Infinion lead was placed just left of anatomic midline in the same levels using the same technique. The patient was awakened and the leads  tested; impedances were good, and the patient reported good coverage with amplitudes in the 3-7 mA range. 0 silk sutures were placed in the fascia adjacent to the needles. The needles and stylets were removed under fluoroscopy with no lead migration noted. Leads were then fixed to the fascia by chest tube-type fixation into position with the sutures; repeat images were obtained to verify that there had been no lead migration.    Attention was then turned to creation of a subcutaneous pocket. At the left flank, a 3 cm incision was made with a 10 blade and using the bovie and blunt dissection a pocket of size appropriate to place a SCS generator. The pocket was trialed, and found to be of adequate size. The pocket was inspected for hemostasis, which was found to be excellent. Using reverse seldinger technique, the leads were tunneled to the pocket site, and the leads inserted into the SCS generator. Impedances were checked, and all found to be excellent. The leads were then all fixed into position with a self-torquing wrench. The wiring was all carefully coiled, placed behind the generator and placed in the pocket.  Both incisions were copiously irrigated with bacitracin-containing irrigation. The lumbar incision was closed in 2 deep layers of interrupted 2-0 vicryl and the skin closed with staples. The pocket incision was closed with a deeper layer of 2-0 vicryl interrupted sutures, and the skin closed with staples. Sterile dressings were applied. Needle, sponge, and instrument counts were correct x2 at the end of the case.  The patient was then carefully awakened from anesthesia, turned supine, an abdominal binder placed, and the patient taken to the recovery room where he underwent complex spinal cord stimulator programming.  COMPLICATIONS: NONE  CONDITION: Stable throughout the course of the procedure and immediately afterward  DISPOSITION: discharge to home,  with antibiotics and pain medicine.  Discussed care with the patient and mother. Followup in clinic will be scheduled in 10-14 days.

## 2016-05-26 NOTE — Discharge Instructions (Signed)
Dr. Ramesha Poster Post-Op Orders ° °• Ice Pack - 20 minutes on (in a pillow case), and 20 minutes off. Wear the ice pack UNDER the binder. °• Follow up in office, they will call you for an appointment in 10 days to 2 weeks. °• Increase activity gradually.   °• No lifting anything heavier than a gallon of milk (10 pounds) until seen in the office. °• Advance diet slowly as tolerated. °• Dressing care:  Keep dressing dry for 3 days, and on Post-op day 4, may shower. °• Call for fever, drainage, and redness. °• No swimming or bathing in a bathtub (do not get into standing water). °•  °

## 2016-05-26 NOTE — Transfer of Care (Signed)
Immediate Anesthesia Transfer of Care Note  Patient: [name]  Procedure(s) Performed: Procedure(s) with comments: LUMBAR SPINAL CORD STIMULATOR INSERTION (N/A) - LUMBAR SPINAL CORD STIMULATOR INSERTION  Patient Location: PACU  Anesthesia Type:MAC  Level of Consciousness: awake, alert  and oriented  Airway & Oxygen Therapy: Patient Spontanous Breathing and Patient connected to face mask oxygen  Post-op Assessment: Report given to RN, Post -op Vital signs reviewed and stable and Patient moving all extremities X 4  Post vital signs: Reviewed and stable  Last Vitals:  Vitals:   05/26/16 0708  BP: (!) 137/99  Pulse: 84  Resp: 20  Temp: 36.7 C    Last Pain:  Vitals:   05/26/16 0754  TempSrc:   PainSc: 6       Patients Stated Pain Goal: 5 (05/26/16 0754)  Complications: No apparent anesthesia complications

## 2016-05-26 NOTE — Anesthesia Preprocedure Evaluation (Addendum)
Anesthesia Evaluation  Patient identified by MRN, date of birth, ID band Patient awake    History of Anesthesia Complications Negative for: history of anesthetic complications  Airway Mallampati: II  TM Distance: >3 FB Neck ROM: Full    Dental  (+) Teeth Intact, Dental Advisory Given   Pulmonary    breath sounds clear to auscultation       Cardiovascular negative cardio ROS   Rhythm:Regular Rate:Normal     Neuro/Psych  Headaches,  Neuromuscular disease    GI/Hepatic negative GI ROS, Neg liver ROS,   Endo/Other  negative endocrine ROS  Renal/GU negative Renal ROS     Musculoskeletal Chronic back pain   Abdominal (+) + obese,   Peds  Hematology negative hematology ROS (+)   Anesthesia Other Findings   Reproductive/Obstetrics                            Anesthesia Physical Anesthesia Plan  ASA: II  Anesthesia Plan: MAC   Post-op Pain Management:    Induction: Intravenous  Airway Management Planned: Simple Face Mask and Natural Airway  Additional Equipment:   Intra-op Plan:   Post-operative Plan: Extubation in OR  Informed Consent: I have reviewed the patients History and Physical, chart, labs and discussed the procedure including the risks, benefits and alternatives for the proposed anesthesia with the patient or authorized representative who has indicated his/her understanding and acceptance.     Plan Discussed with:   Anesthesia Plan Comments:         Anesthesia Quick Evaluation

## 2016-05-28 ENCOUNTER — Encounter (HOSPITAL_COMMUNITY): Payer: Self-pay | Admitting: Anesthesiology

## 2016-10-19 DIAGNOSIS — R030 Elevated blood-pressure reading, without diagnosis of hypertension: Secondary | ICD-10-CM | POA: Diagnosis not present

## 2016-10-19 DIAGNOSIS — Z9689 Presence of other specified functional implants: Secondary | ICD-10-CM | POA: Diagnosis not present

## 2016-10-19 DIAGNOSIS — M545 Low back pain: Secondary | ICD-10-CM | POA: Diagnosis not present

## 2016-10-19 DIAGNOSIS — M961 Postlaminectomy syndrome, not elsewhere classified: Secondary | ICD-10-CM | POA: Diagnosis not present

## 2016-12-11 IMAGING — RF DG THORACIC SPINE 1V
1 series · 1 of 1 positions shown · non-contrast
Comparison: None.

CLINICAL DATA: Elective surgery.  Stimulator placement

EXAM:
DG C-ARM 61-120 MIN; THORACIC SPINE - 1 VIEW

[Series 1: run · 1 of 1 slices shown]
[im 1/1]
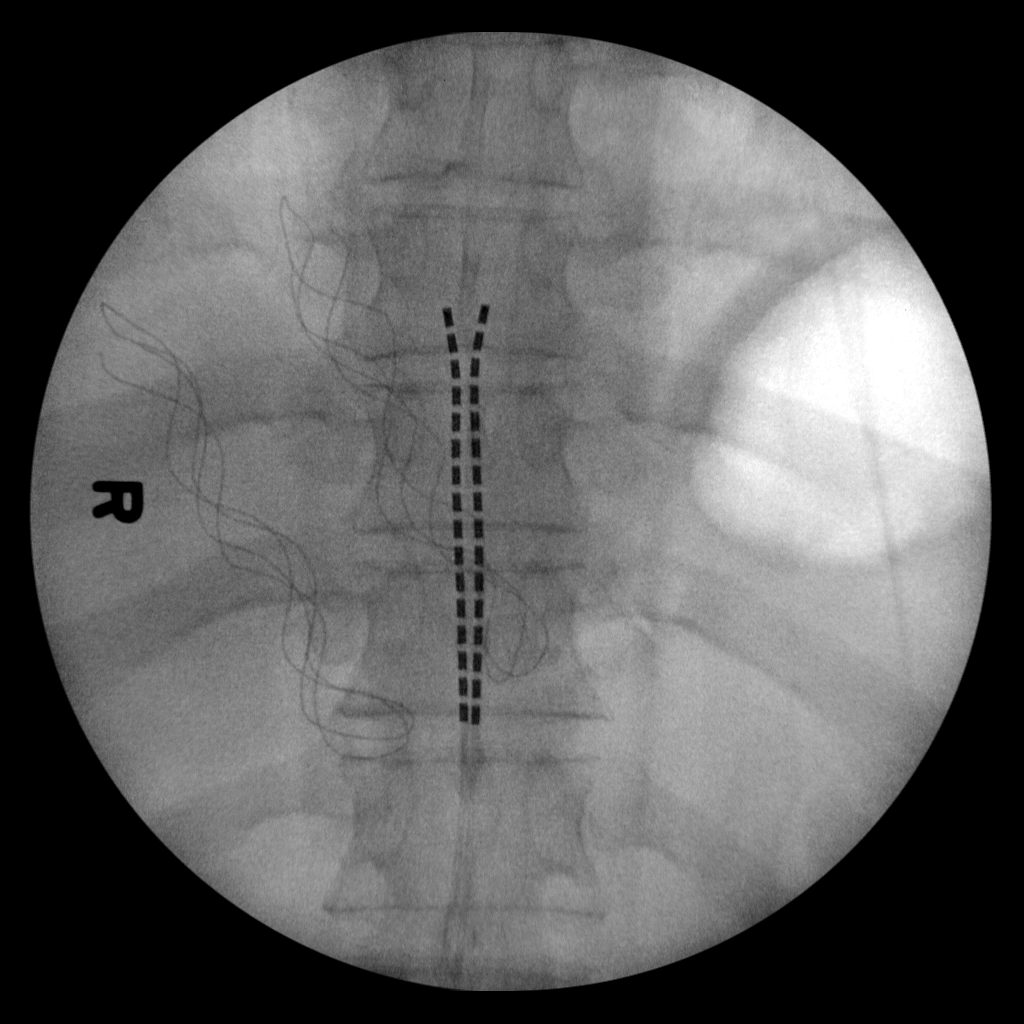

[1 of 1 positions shown; findings below may reference images not displayed]

FINDINGS: Single intraoperative spot image demonstrates placement of
stimulator wires which project over the midline in the lower
thoracic spine region.
IMPRESSION: Stimulator wires project over the midline in the lower thoracic
spine.

## 2017-01-11 DIAGNOSIS — Z9689 Presence of other specified functional implants: Secondary | ICD-10-CM | POA: Diagnosis not present

## 2017-01-11 DIAGNOSIS — R030 Elevated blood-pressure reading, without diagnosis of hypertension: Secondary | ICD-10-CM | POA: Diagnosis not present

## 2017-01-11 DIAGNOSIS — M545 Low back pain: Secondary | ICD-10-CM | POA: Diagnosis not present

## 2017-01-11 DIAGNOSIS — Z6834 Body mass index (BMI) 34.0-34.9, adult: Secondary | ICD-10-CM | POA: Diagnosis not present

## 2017-02-14 DIAGNOSIS — G894 Chronic pain syndrome: Secondary | ICD-10-CM | POA: Diagnosis not present

## 2017-02-14 DIAGNOSIS — R5383 Other fatigue: Secondary | ICD-10-CM | POA: Diagnosis not present

## 2017-02-14 DIAGNOSIS — R7301 Impaired fasting glucose: Secondary | ICD-10-CM | POA: Diagnosis not present

## 2017-02-14 DIAGNOSIS — I10 Essential (primary) hypertension: Secondary | ICD-10-CM | POA: Diagnosis not present

## 2017-07-10 DIAGNOSIS — M25552 Pain in left hip: Secondary | ICD-10-CM | POA: Diagnosis not present

## 2017-07-10 DIAGNOSIS — Z9689 Presence of other specified functional implants: Secondary | ICD-10-CM | POA: Diagnosis not present

## 2017-07-10 DIAGNOSIS — M545 Low back pain: Secondary | ICD-10-CM | POA: Diagnosis not present

## 2017-07-17 DIAGNOSIS — N342 Other urethritis: Secondary | ICD-10-CM | POA: Diagnosis not present

## 2017-07-17 DIAGNOSIS — N341 Nonspecific urethritis: Secondary | ICD-10-CM | POA: Diagnosis not present

## 2017-07-17 DIAGNOSIS — Z6837 Body mass index (BMI) 37.0-37.9, adult: Secondary | ICD-10-CM | POA: Diagnosis not present

## 2017-07-24 DIAGNOSIS — Z0001 Encounter for general adult medical examination with abnormal findings: Secondary | ICD-10-CM | POA: Diagnosis not present

## 2017-07-24 DIAGNOSIS — R799 Abnormal finding of blood chemistry, unspecified: Secondary | ICD-10-CM | POA: Diagnosis not present

## 2017-10-10 DIAGNOSIS — Z9689 Presence of other specified functional implants: Secondary | ICD-10-CM | POA: Diagnosis not present

## 2017-10-10 DIAGNOSIS — M961 Postlaminectomy syndrome, not elsewhere classified: Secondary | ICD-10-CM | POA: Diagnosis not present

## 2017-10-10 DIAGNOSIS — M545 Low back pain: Secondary | ICD-10-CM | POA: Diagnosis not present

## 2018-01-08 DIAGNOSIS — M545 Low back pain: Secondary | ICD-10-CM | POA: Diagnosis not present

## 2018-01-08 DIAGNOSIS — Z9689 Presence of other specified functional implants: Secondary | ICD-10-CM | POA: Diagnosis not present

## 2018-01-08 DIAGNOSIS — M961 Postlaminectomy syndrome, not elsewhere classified: Secondary | ICD-10-CM | POA: Diagnosis not present

## 2018-01-08 DIAGNOSIS — Z6837 Body mass index (BMI) 37.0-37.9, adult: Secondary | ICD-10-CM | POA: Diagnosis not present

## 2018-04-09 DIAGNOSIS — Z6836 Body mass index (BMI) 36.0-36.9, adult: Secondary | ICD-10-CM | POA: Diagnosis not present

## 2018-04-09 DIAGNOSIS — I10 Essential (primary) hypertension: Secondary | ICD-10-CM | POA: Diagnosis not present

## 2018-04-09 DIAGNOSIS — M545 Low back pain: Secondary | ICD-10-CM | POA: Diagnosis not present

## 2018-04-09 DIAGNOSIS — M961 Postlaminectomy syndrome, not elsewhere classified: Secondary | ICD-10-CM | POA: Diagnosis not present

## 2018-06-27 DIAGNOSIS — Z6835 Body mass index (BMI) 35.0-35.9, adult: Secondary | ICD-10-CM | POA: Diagnosis not present

## 2018-06-27 DIAGNOSIS — I10 Essential (primary) hypertension: Secondary | ICD-10-CM | POA: Diagnosis not present

## 2018-06-27 DIAGNOSIS — M545 Low back pain: Secondary | ICD-10-CM | POA: Diagnosis not present

## 2018-06-27 DIAGNOSIS — M961 Postlaminectomy syndrome, not elsewhere classified: Secondary | ICD-10-CM | POA: Diagnosis not present

## 2018-09-25 DIAGNOSIS — Z6837 Body mass index (BMI) 37.0-37.9, adult: Secondary | ICD-10-CM | POA: Diagnosis not present

## 2018-09-25 DIAGNOSIS — M961 Postlaminectomy syndrome, not elsewhere classified: Secondary | ICD-10-CM | POA: Diagnosis not present

## 2018-09-25 DIAGNOSIS — M545 Low back pain: Secondary | ICD-10-CM | POA: Diagnosis not present

## 2018-09-25 DIAGNOSIS — I10 Essential (primary) hypertension: Secondary | ICD-10-CM | POA: Diagnosis not present

## 2018-12-19 DIAGNOSIS — M545 Low back pain: Secondary | ICD-10-CM | POA: Diagnosis not present

## 2018-12-19 DIAGNOSIS — M961 Postlaminectomy syndrome, not elsewhere classified: Secondary | ICD-10-CM | POA: Diagnosis not present

## 2019-03-20 DIAGNOSIS — Z6835 Body mass index (BMI) 35.0-35.9, adult: Secondary | ICD-10-CM | POA: Diagnosis not present

## 2019-03-20 DIAGNOSIS — M961 Postlaminectomy syndrome, not elsewhere classified: Secondary | ICD-10-CM | POA: Diagnosis not present

## 2019-03-20 DIAGNOSIS — Z9689 Presence of other specified functional implants: Secondary | ICD-10-CM | POA: Diagnosis not present

## 2019-03-20 DIAGNOSIS — R030 Elevated blood-pressure reading, without diagnosis of hypertension: Secondary | ICD-10-CM | POA: Diagnosis not present

## 2019-03-20 DIAGNOSIS — Z79899 Other long term (current) drug therapy: Secondary | ICD-10-CM | POA: Diagnosis not present

## 2019-06-19 DIAGNOSIS — Z6835 Body mass index (BMI) 35.0-35.9, adult: Secondary | ICD-10-CM | POA: Diagnosis not present

## 2019-06-19 DIAGNOSIS — R030 Elevated blood-pressure reading, without diagnosis of hypertension: Secondary | ICD-10-CM | POA: Diagnosis not present

## 2019-08-22 DIAGNOSIS — R0981 Nasal congestion: Secondary | ICD-10-CM | POA: Diagnosis not present

## 2019-08-22 DIAGNOSIS — R05 Cough: Secondary | ICD-10-CM | POA: Diagnosis not present

## 2019-08-22 DIAGNOSIS — Z0389 Encounter for observation for other suspected diseases and conditions ruled out: Secondary | ICD-10-CM | POA: Diagnosis not present
